# Patient Record
Sex: Female | Born: 1970
Health system: Southern US, Community
[De-identification: ages and names within clinical notes are randomized; demographics above are authoritative.]

## PROBLEM LIST (undated history)

## (undated) DIAGNOSIS — B019 Varicella without complication: Secondary | ICD-10-CM

## (undated) HISTORY — DX: Varicella without complication: B01.9

## (undated) HISTORY — PX: NO PAST SURGERIES: SHX2092

---

## 2002-03-03 ENCOUNTER — Inpatient Hospital Stay (HOSPITAL_COMMUNITY): Admission: AD | Admit: 2002-03-03 | Discharge: 2002-03-03 | Payer: Self-pay | Admitting: Obstetrics and Gynecology

## 2002-03-06 ENCOUNTER — Inpatient Hospital Stay (HOSPITAL_COMMUNITY): Admission: AD | Admit: 2002-03-06 | Discharge: 2002-03-08 | Payer: Self-pay | Admitting: Obstetrics and Gynecology

## 2003-05-07 ENCOUNTER — Other Ambulatory Visit: Admission: RE | Admit: 2003-05-07 | Discharge: 2003-05-07 | Payer: Self-pay | Admitting: Obstetrics and Gynecology

## 2004-12-17 ENCOUNTER — Other Ambulatory Visit: Admission: RE | Admit: 2004-12-17 | Discharge: 2004-12-17 | Payer: Self-pay | Admitting: Obstetrics and Gynecology

## 2010-11-23 ENCOUNTER — Other Ambulatory Visit (HOSPITAL_COMMUNITY): Payer: Self-pay | Admitting: Obstetrics and Gynecology

## 2010-11-23 DIAGNOSIS — Z1231 Encounter for screening mammogram for malignant neoplasm of breast: Secondary | ICD-10-CM

## 2010-12-02 ENCOUNTER — Ambulatory Visit (HOSPITAL_COMMUNITY)
Admission: RE | Admit: 2010-12-02 | Discharge: 2010-12-02 | Disposition: A | Discharge status: Home / Self Care | Payer: BC Managed Care – PPO | Source: Ambulatory Visit | Attending: Obstetrics and Gynecology | Admitting: Obstetrics and Gynecology

## 2010-12-02 DIAGNOSIS — Z1231 Encounter for screening mammogram for malignant neoplasm of breast: Secondary | ICD-10-CM | POA: Insufficient documentation

## 2012-01-26 ENCOUNTER — Other Ambulatory Visit (HOSPITAL_COMMUNITY): Payer: Self-pay | Admitting: Obstetrics and Gynecology

## 2012-01-26 DIAGNOSIS — Z1231 Encounter for screening mammogram for malignant neoplasm of breast: Secondary | ICD-10-CM

## 2012-02-15 ENCOUNTER — Ambulatory Visit (HOSPITAL_COMMUNITY)
Admission: RE | Admit: 2012-02-15 | Discharge: 2012-02-15 | Disposition: A | Discharge status: Home / Self Care | Payer: BC Managed Care – PPO | Source: Ambulatory Visit | Attending: Obstetrics and Gynecology | Admitting: Obstetrics and Gynecology

## 2012-02-15 DIAGNOSIS — Z1231 Encounter for screening mammogram for malignant neoplasm of breast: Secondary | ICD-10-CM

## 2013-06-12 ENCOUNTER — Encounter: Payer: Self-pay | Admitting: Nurse Practitioner

## 2013-06-12 ENCOUNTER — Ambulatory Visit (INDEPENDENT_AMBULATORY_CARE_PROVIDER_SITE_OTHER): Payer: BC Managed Care – PPO | Admitting: Nurse Practitioner

## 2013-06-12 VITALS — BP 100/68 | HR 75 | Temp 97.5°F | Ht 64.16 in | Wt 135.8 lb

## 2013-06-12 DIAGNOSIS — Z0000 Encounter for general adult medical examination without abnormal findings: Secondary | ICD-10-CM

## 2013-06-12 DIAGNOSIS — R0789 Other chest pain: Secondary | ICD-10-CM

## 2013-06-12 DIAGNOSIS — R609 Edema, unspecified: Secondary | ICD-10-CM

## 2013-06-12 DIAGNOSIS — R600 Localized edema: Secondary | ICD-10-CM

## 2013-06-12 LAB — COMPREHENSIVE METABOLIC PANEL
ALK PHOS: 48 U/L (ref 39–117)
ALT: 41 U/L — ABNORMAL HIGH (ref 0–35)
AST: 28 U/L (ref 0–37)
Albumin: 4.4 g/dL (ref 3.5–5.2)
BILIRUBIN TOTAL: 0.9 mg/dL (ref 0.3–1.2)
BUN: 6 mg/dL (ref 6–23)
CO2: 28 mEq/L (ref 19–32)
Calcium: 9.4 mg/dL (ref 8.4–10.5)
Chloride: 102 mEq/L (ref 96–112)
Creatinine, Ser: 0.5 mg/dL (ref 0.4–1.2)
GFR: 136.25 mL/min (ref >60.00)
GLUCOSE: 79 mg/dL (ref 70–99)
Potassium: 4.3 mEq/L (ref 3.5–5.1)
Sodium: 136 mEq/L (ref 135–145)
Total Protein: 8 g/dL (ref 6.0–8.3)

## 2013-06-12 LAB — CBC
HEMATOCRIT: 40.7 % (ref 36.0–46.0)
Hemoglobin: 13.5 g/dL (ref 12.0–15.0)
MCHC: 33.2 g/dL (ref 30.0–36.0)
MCV: 92.7 fl (ref 78.0–100.0)
PLATELETS: 238 10*3/uL (ref 150.0–400.0)
RBC: 4.39 Mil/uL (ref 3.87–5.11)
RDW: 12.1 % (ref 11.5–14.6)
WBC: 7.7 10*3/uL (ref 4.5–10.5)

## 2013-06-12 LAB — URINALYSIS, MICROSCOPIC ONLY

## 2013-06-12 LAB — TSH: TSH: 1.64 u[IU]/mL (ref 0.35–5.50)

## 2013-06-12 LAB — LIPID PANEL
CHOLESTEROL: 173 mg/dL (ref 0–200)
HDL: 76.5 mg/dL (ref >39.00)
LDL Cholesterol: 88 mg/dL (ref 0–99)
TRIGLYCERIDES: 44 mg/dL (ref 0.0–149.0)
Total CHOL/HDL Ratio: 2
VLDL: 8.8 mg/dL (ref 0.0–40.0)

## 2013-06-12 LAB — HIGH SENSITIVITY CRP: CRP HIGH SENSITIVITY: 1.04 mg/L (ref 0.000–5.000)

## 2013-06-12 LAB — T4, FREE: FREE T4: 0.95 ng/dL (ref 0.60–1.60)

## 2013-06-12 NOTE — Progress Notes (Signed)
Pre visit review using our clinic review tool, if applicable. No additional management support is needed unless otherwise documented below in the visit note. 

## 2013-06-12 NOTE — Patient Instructions (Signed)
Our office will call you with lab results and any necessary follow up. Continue to exercise 30 minutes most days of the week. I think the pain you are experiencing is musculoskeletal, but if it does not subside consider seeing cardiology for further work up.   Preventive Care for Adults, Female A healthy lifestyle and preventive care can promote health and wellness. Preventive health guidelines for women include the following key practices.  A routine yearly physical is a good way to check with your caregiver about your health and preventive screening. It is a chance to share any concerns and updates on your health, and to receive a thorough exam.  Visit your dentist for a routine exam and preventive care every 6 months. Brush your teeth twice a day and floss once a day. Good oral hygiene prevents tooth decay and gum disease.  The frequency of eye exams is based on your age, health, family medical history, use of contact lenses, and other factors. Follow your caregiver's recommendations for frequency of eye exams.  Eat a healthy diet. Foods like vegetables, fruits, whole grains, low-fat dairy products, and lean protein foods contain the nutrients you need without too many calories. Decrease your intake of foods high in solid fats, added sugars, and salt. Eat the right amount of calories for you.Get information about a proper diet from your caregiver, if necessary.  Regular physical exercise is one of the most important things you can do for your health. Most adults should get at least 150 minutes of moderate-intensity exercise (any activity that increases your heart rate and causes you to sweat) each week. In addition, most adults need muscle-strengthening exercises on 2 or more days a week.  Maintain a healthy weight. The body mass index (BMI) is a screening tool to identify possible weight problems. It provides an estimate of body fat based on height and weight. Your caregiver can help determine  your BMI, and can help you achieve or maintain a healthy weight.For adults 20 years and older:  A BMI below 18.5 is considered underweight.  A BMI of 18.5 to 24.9 is normal.  A BMI of 25 to 29.9 is considered overweight.  A BMI of 30 and above is considered obese.  Maintain normal blood lipids and cholesterol levels by exercising and minimizing your intake of saturated fat. Eat a balanced diet with plenty of fruit and vegetables. Blood tests for lipids and cholesterol should begin at age 44 and be repeated every 5 years. If your lipid or cholesterol levels are high, you are over 50, or you are at high risk for heart disease, you may need your cholesterol levels checked more frequently.Ongoing high lipid and cholesterol levels should be treated with medicines if diet and exercise are not effective.  If you smoke, find out from your caregiver how to quit. If you do not use tobacco, do not start.  Lung cancer screening is recommended for adults aged 16 80 years who are at high risk for developing lung cancer because of a history of smoking. Yearly low-dose computed tomography (CT) is recommended for people who have at least a 30-pack-year history of smoking and are a current smoker or have quit within the past 15 years. A pack year of smoking is smoking an average of 1 pack of cigarettes a day for 1 year (for example: 1 pack a day for 30 years or 2 packs a day for 15 years). Yearly screening should continue until the smoker has stopped smoking for at  least 15 years. Yearly screening should also be stopped for people who develop a health problem that would prevent them from having lung cancer treatment.  If you are pregnant, do not drink alcohol. If you are breastfeeding, be very cautious about drinking alcohol. If you are not pregnant and choose to drink alcohol, do not exceed 1 drink per day. One drink is considered to be 12 ounces (355 mL) of beer, 5 ounces (148 mL) of wine, or 1.5 ounces (44 mL) of  liquor.  Avoid use of street drugs. Do not share needles with anyone. Ask for help if you need support or instructions about stopping the use of drugs.  High blood pressure causes heart disease and increases the risk of stroke. Your blood pressure should be checked at least every 1 to 2 years. Ongoing high blood pressure should be treated with medicines if weight loss and exercise are not effective.  If you are 43 to 44 years old, ask your caregiver if you should take aspirin to prevent strokes.  Diabetes screening involves taking a blood sample to check your fasting blood sugar level. This should be done once every 3 years, after age 12, if you are within normal weight and without risk factors for diabetes. Testing should be considered at a younger age or be carried out more frequently if you are overweight and have at least 1 risk factor for diabetes.  Breast cancer screening is essential preventive care for women. You should practice "breast self-awareness." This means understanding the normal appearance and feel of your breasts and may include breast self-examination. Any changes detected, no matter how small, should be reported to a caregiver. Women in their 77s and 30s should have a clinical breast exam (CBE) by a caregiver as part of a regular health exam every 1 to 3 years. After age 34, women should have a CBE every year. Starting at age 62, women should consider having a mammography (breast X-ray test) every year. Women who have a family history of breast cancer should talk to their caregiver about genetic screening. Women at a high risk of breast cancer should talk to their caregivers about having magnetic resonance imaging (MRI) and a mammography every year.  Breast cancer gene (BRCA)-related cancer risk assessment is recommended for women who have family members with BRCA-related cancers. BRCA-related cancers include breast, ovarian, tubal, and peritoneal cancers. Having family members with  these cancers may be associated with an increased risk for harmful changes (mutations) in the breast cancer genes BRCA1 and BRCA2. Results of the assessment will determine the need for genetic counseling and BRCA1 and BRCA2 testing.  The Pap test is a screening test for cervical cancer. A Pap test can show cell changes on the cervix that might become cervical cancer if left untreated. A Pap test is a procedure in which cells are obtained and examined from the lower end of the uterus (cervix).  Women should have a Pap test starting at age 55.  Between ages 36 and 23, Pap tests should be repeated every 2 years.  Beginning at age 21, you should have a Pap test every 3 years as long as the past 3 Pap tests have been normal.  Some women have medical problems that increase the chance of getting cervical cancer. Talk to your caregiver about these problems. It is especially important to talk to your caregiver if a new problem develops soon after your last Pap test. In these cases, your caregiver may recommend more frequent  screening and Pap tests.  The above recommendations are the same for women who have or have not gotten the vaccine for human papillomavirus (HPV).  If you had a hysterectomy for a problem that was not cancer or a condition that could lead to cancer, then you no longer need Pap tests. Even if you no longer need a Pap test, a regular exam is a good idea to make sure no other problems are starting.  If you are between ages 4 and 53, and you have had normal Pap tests going back 10 years, you no longer need Pap tests. Even if you no longer need a Pap test, a regular exam is a good idea to make sure no other problems are starting.  If you have had past treatment for cervical cancer or a condition that could lead to cancer, you need Pap tests and screening for cancer for at least 20 years after your treatment.  If Pap tests have been discontinued, risk factors (such as a new sexual partner)  need to be reassessed to determine if screening should be resumed.  The HPV test is an additional test that may be used for cervical cancer screening. The HPV test looks for the virus that can cause the cell changes on the cervix. The cells collected during the Pap test can be tested for HPV. The HPV test could be used to screen women aged 40 years and older, and should be used in women of any age who have unclear Pap test results. After the age of 64, women should have HPV testing at the same frequency as a Pap test.  Colorectal cancer can be detected and often prevented. Most routine colorectal cancer screening begins at the age of 81 and continues through age 100. However, your caregiver may recommend screening at an earlier age if you have risk factors for colon cancer. On a yearly basis, your caregiver may provide home test kits to check for hidden blood in the stool. Use of a small camera at the end of a tube, to directly examine the colon (sigmoidoscopy or colonoscopy), can detect the earliest forms of colorectal cancer. Talk to your caregiver about this at age 2, when routine screening begins. Direct examination of the colon should be repeated every 5 to 10 years through age 35, unless early forms of pre-cancerous polyps or small growths are found.  Hepatitis C blood testing is recommended for all people born from [birth year] through 1965 and any individual with known risks for hepatitis C.  Practice safe sex. Use condoms and avoid high-risk sexual practices to reduce the spread of sexually transmitted infections (STIs). STIs include gonorrhea, chlamydia, syphilis, trichomonas, herpes, HPV, and human immunodeficiency virus (HIV). Herpes, HIV, and HPV are viral illnesses that have no cure. They can result in disability, cancer, and death. Sexually active women aged 3 and younger should be checked for chlamydia. Older women with new or multiple partners should also be tested for chlamydia. Testing for other  STIs is recommended if you are sexually active and at increased risk.  Osteoporosis is a disease in which the bones lose minerals and strength with aging. This can result in serious bone fractures. The risk of osteoporosis can be identified using a bone density scan. Women ages 43 and over and women at risk for fractures or osteoporosis should discuss screening with their caregivers. Ask your caregiver whether you should take a calcium supplement or vitamin D to reduce the rate of osteoporosis.  Menopause can  be associated with physical symptoms and risks. Hormone replacement therapy is available to decrease symptoms and risks. You should talk to your caregiver about whether hormone replacement therapy is right for you.  Use sunscreen. Apply sunscreen liberally and repeatedly throughout the day. You should seek shade when your shadow is shorter than you. Protect yourself by wearing long sleeves, pants, a wide-brimmed hat, and sunglasses year round, whenever you are outdoors.  Once a month, do a whole body skin exam, using a mirror to look at the skin on your back. Notify your caregiver of new moles, moles that have irregular borders, moles that are larger than a pencil eraser, or moles that have changed in shape or color.  Stay current with required immunizations.  Influenza vaccine. All adults should be immunized every year.  Tetanus, diphtheria, and acellular pertussis (Td, Tdap) vaccine. Pregnant women should receive 1 dose of Tdap vaccine during each pregnancy. The dose should be obtained regardless of the length of time since the last dose. Immunization is preferred during the 27th to 36th week of gestation. An adult who has not previously received Tdap or who does not know her vaccine status should receive 1 dose of Tdap. This initial dose should be followed by tetanus and diphtheria toxoids (Td) booster doses every 10 years. Adults with an unknown or incomplete history of completing a 3-dose  immunization series with Td-containing vaccines should begin or complete a primary immunization series including a Tdap dose. Adults should receive a Td booster every 10 years.  Varicella vaccine. An adult without evidence of immunity to varicella should receive 2 doses or a second dose if she has previously received 1 dose. Pregnant females who do not have evidence of immunity should receive the first dose after pregnancy. This first dose should be obtained before leaving the health care facility. The second dose should be obtained 4 8 weeks after the first dose.  Human papillomavirus (HPV) vaccine. Females aged 98 26 years who have not received the vaccine previously should obtain the 3-dose series. The vaccine is not recommended for use in pregnant females. However, pregnancy testing is not needed before receiving a dose. If a female is found to be pregnant after receiving a dose, no treatment is needed. In that case, the remaining doses should be delayed until after the pregnancy. Immunization is recommended for any person with an immunocompromised condition through the age of 64 years if she did not get any or all doses earlier. During the 3-dose series, the second dose should be obtained 4 8 weeks after the first dose. The third dose should be obtained 24 weeks after the first dose and 16 weeks after the second dose.  Zoster vaccine. One dose is recommended for adults aged 46 years or older unless certain conditions are present.  Measles, mumps, and rubella (MMR) vaccine. Adults born before [birth year] generally are considered immune to measles and mumps. Adults born in [birth year] or later should have 1 or more doses of MMR vaccine unless there is a contraindication to the vaccine or there is laboratory evidence of immunity to each of the three diseases. A routine second dose of MMR vaccine should be obtained at least 28 days after the first dose for students attending postsecondary schools, health care workers, or  international travelers. People who received inactivated measles vaccine or an unknown type of measles vaccine during 1963 1967 should receive 2 doses of MMR vaccine. People who received inactivated mumps vaccine or an unknown type of mumps  vaccine before 1979 and are at high risk for mumps infection should consider immunization with 2 doses of MMR vaccine. For females of childbearing age, rubella immunity should be determined. If there is no evidence of immunity, females who are not pregnant should be vaccinated. If there is no evidence of immunity, females who are pregnant should delay immunization until after pregnancy. Unvaccinated health care workers born before [birth year] who lack laboratory evidence of measles, mumps, or rubella immunity or laboratory confirmation of disease should consider measles and mumps immunization with 2 doses of MMR vaccine or rubella immunization with 1 dose of MMR vaccine.  Pneumococcal 13-valent conjugate (PCV13) vaccine. When indicated, a person who is uncertain of her immunization history and has no record of immunization should receive the PCV13 vaccine. An adult aged 54 years or older who has certain medical conditions and has not been previously immunized should receive 1 dose of PCV13 vaccine. This PCV13 should be followed with a dose of pneumococcal polysaccharide (PPSV23) vaccine. The PPSV23 vaccine dose should be obtained at least 8 weeks after the dose of PCV13 vaccine. An adult aged 41 years or older who has certain medical conditions and previously received 1 or more doses of PPSV23 vaccine should receive 1 dose of PCV13. The PCV13 vaccine dose should be obtained 1 or more years after the last PPSV23 vaccine dose.  Pneumococcal polysaccharide (PPSV23) vaccine. When PCV13 is also indicated, PCV13 should be obtained first. All adults aged 107 years and older should be immunized. An adult younger than age 52 years who has certain medical conditions should be immunized. Any  person who resides in a nursing home or long-term care facility should be immunized. An adult smoker should be immunized. People with an immunocompromised condition and certain other conditions should receive both PCV13 and PPSV23 vaccines. People with human immunodeficiency virus (HIV) infection should be immunized as soon as possible after diagnosis. Immunization during chemotherapy or radiation therapy should be avoided. Routine use of PPSV23 vaccine is not recommended for American Indians, [place] Natives, or people younger than 65 years unless there are medical conditions that require PPSV23 vaccine. When indicated, people who have unknown immunization and have no record of immunization should receive PPSV23 vaccine. One-time revaccination 5 years after the first dose of PPSV23 is recommended for people aged 60 64 years who have chronic kidney failure, nephrotic syndrome, asplenia, or immunocompromised conditions. People who received 1 2 doses of PPSV23 before age 26 years should receive another dose of PPSV23 vaccine at age 41 years or later if at least 5 years have passed since the previous dose. Doses of PPSV23 are not needed for people immunized with PPSV23 at or after age 30 years.  Meningococcal vaccine. Adults with asplenia or persistent complement component deficiencies should receive 2 doses of quadrivalent meningococcal conjugate (MenACWY-D) vaccine. The doses should be obtained at least 2 months apart. Microbiologists working with certain meningococcal bacteria, Section recruits, people at risk during an outbreak, and people who travel to or live in countries with a high rate of meningitis should be immunized. A first-year college student up through age 58 years who is living in a residence hall should receive a dose if she did not receive a dose on or after her 16th birthday. Adults who have certain high-risk conditions should receive one or more doses of vaccine.  Hepatitis A vaccine. Adults  who wish to be protected from this disease, have certain high-risk conditions, work with hepatitis A-infected animals, work in hepatitis A  research labs, or travel to or work in countries with a high rate of hepatitis A should be immunized. Adults who were previously unvaccinated and who anticipate close contact with an international adoptee during the first 60 days after arrival in the [place] from a country with a high rate of hepatitis A should be immunized.  Hepatitis B vaccine. Adults who wish to be protected from this disease, have certain high-risk conditions, may be exposed to blood or other infectious body fluids, are household contacts or sex partners of hepatitis B positive people, are clients or workers in certain care facilities, or travel to or work in countries with a high rate of hepatitis B should be immunized.  Haemophilus influenzae type b (Hib) vaccine. A previously unvaccinated person with asplenia or sickle cell disease or having a scheduled splenectomy should receive 1 dose of Hib vaccine. Regardless of previous immunization, a recipient of a hematopoietic stem cell transplant should receive a 3-dose series 6 12 months after her successful transplant. Hib vaccine is not recommended for adults with HIV infection. Preventive Services / Frequency Ages 34 to 51  Blood pressure check.** / Every 1 to 2 years.  Lipid and cholesterol check.** / Every 5 years beginning at age 52.  Lung cancer screening. / Every year if you are aged 70 80 years and have a 30-pack-year history of smoking and currently smoke or have quit within the past 15 years. Yearly screening is stopped once you have quit smoking for at least 15 years or develop a health problem that would prevent you from having lung cancer treatment.  Clinical breast exam.** / Every year after age 31.  BRCA-related cancer risk assessment.** / For women who have family members with a BRCA-related cancer (breast, ovarian, tubal,  or peritoneal cancers).  Mammogram.** / Every year beginning at age 39 and continuing for as long as you are in good health. Consult with your caregiver.  Pap test.** / Every 3 years starting at age 65 through age 37 or 43 with a history of 3 consecutive normal Pap tests.  HPV screening.** / Every 3 years from ages 34 through ages 65 to 62 with a history of 3 consecutive normal Pap tests.  Fecal occult blood test (FOBT) of stool. / Every year beginning at age 31 and continuing until age 69. You may not need to do this test if you get a colonoscopy every 10 years.  Flexible sigmoidoscopy or colonoscopy.** / Every 5 years for a flexible sigmoidoscopy or every 10 years for a colonoscopy beginning at age 41 and continuing until age 80.  Hepatitis C blood test.** / For all people born from [birth year] through 1966 and any individual with known risks for hepatitis C.  Skin self-exam. / Monthly.  Influenza vaccine. / Every year.  Tetanus, diphtheria, and acellular pertussis (Tdap/Td) vaccine.** / Consult your caregiver. Pregnant women should receive 1 dose of Tdap vaccine during each pregnancy. 1 dose of Td every 10 years.  Varicella vaccine.** / Consult your caregiver. Pregnant females who do not have evidence of immunity should receive the first dose after pregnancy.  Zoster vaccine.** / 1 dose for adults aged 75 years or older.  Measles, mumps, rubella (MMR) vaccine.** / You need at least 1 dose of MMR if you were born in 1957 or later. You may also need a 2nd dose. For females of childbearing age, rubella immunity should be determined. If there is no evidence of immunity, females who are not pregnant should be  vaccinated. If there is no evidence of immunity, females who are pregnant should delay immunization until after pregnancy.  Pneumococcal 13-valent conjugate (PCV13) vaccine.** / Consult your caregiver.  Pneumococcal polysaccharide (PPSV23) vaccine.** / 1 to 2 doses if you smoke cigarettes or  if you have certain conditions.  Meningococcal vaccine.** / Consult your caregiver.  Hepatitis A vaccine.** / Consult your caregiver.  Hepatitis B vaccine.** / Consult your caregiver.  Haemophilus influenzae type b (Hib) vaccine.** / Consult your caregiver. ** Family history and personal history of risk and conditions may change your caregiver's recommendations. Document Released: 05/25/2001 Document Revised: 07/24/2012 Document Reviewed: 08/24/2010 [facility] Patient Information 2014 [place], [place].

## 2013-06-13 LAB — VITAMIN D 25 HYDROXY (VIT D DEFICIENCY, FRACTURES): Vit D, 25-Hydroxy: 49 ng/mL (ref 30–89)

## 2013-06-13 LAB — HEMOGLOBIN A1C: HEMOGLOBIN A1C: 5.2 % (ref 4.6–6.5)

## 2013-06-14 ENCOUNTER — Encounter: Payer: Self-pay | Admitting: Nurse Practitioner

## 2013-06-14 DIAGNOSIS — R600 Localized edema: Secondary | ICD-10-CM | POA: Insufficient documentation

## 2013-06-14 DIAGNOSIS — R0789 Other chest pain: Secondary | ICD-10-CM | POA: Insufficient documentation

## 2013-06-14 DIAGNOSIS — Z0000 Encounter for general adult medical examination without abnormal findings: Secondary | ICD-10-CM | POA: Insufficient documentation

## 2013-06-14 NOTE — Assessment & Plan Note (Addendum)
FROM bilat shoulders. No pectoral or trap tenderness. FROM neck & head. No carotid bruit. Heart RRR. Nml exam. Likely musculoskeletal as pain relieved by ibuprophen. Labs today: lipids, CBC, TSH, CMET, hs-CRP Pt declines cardio referral.  Will give symptoms few more weeks. If no resolve will call ofc.

## 2013-06-14 NOTE — Assessment & Plan Note (Signed)
No edema on exam. Few superficial veins, no bulge. Pedal pulses +2. Nml color. Continue hydrating fluids & limiting caffeinated beverages. Stand & Walk few minutes every hour while at work. CMET, TSH CBC today.

## 2013-06-14 NOTE — Assessment & Plan Note (Signed)
CPE today. Will continue to see gyn for womancare. Recmd Tdap & flu, pt declines today. Vit D & urine micro, CBC, CMET, TSH, lipids, HgbA1C. No risk factors for DM, or heart ds. Next CPE 2 yrs.

## 2013-06-14 NOTE — Progress Notes (Signed)
Subjective:     Gilles ChiquitoKelly Munguia is a 43 y.o. female and is here for a comprehensive physical exam. The patient reports problems - bilateral shoulder & chest discomfort.Marland Kitchen.  History   Social History  . Marital Status: Married    Spouse Name: Bill    Number of Children: 2  . Years of Education: College Gd   Occupational History  . 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 Smoking status: Never Smoker   . Smokeless tobacco: Never Used  . Alcohol Use: No  . Drug Use: No  . Sexual Activity: Yes    Birth Control/ Protection: Surgical   Other Topics Concern  . Not on file   Social History Narrative   Ms. Whetsel lives with her husband & 2 children. She works PT as a Marketing executivefinancial plan administrator. She is from [place]   Health Maintenance  Topic Date Due  . Influenza Vaccine  02/14/2014 (Originally 11/10/2012)  . Tetanus/tdap  06/15/2015 (Originally 08/08/1988)  . Pap Smear  06/15/2014    The following portions of the patient's history were reviewed and updated as appropriate: allergies, current medications, past family history, past medical history, past social history, past surgical history and problem list.  Review of Systems Constitutional: negative for chills, fatigue, fevers and night sweats Eyes: negative, last eye exam 2013.  Ears, nose, mouth, throat, and face: negative for nasal congestion and sore throat Respiratory: negative for asthma, cough and sputum Cardiovascular: positive for chest pressure/discomfort and lower extremity edema, negative for chest pain, dyspnea, fatigue, irregular heart beat, near-syncope and palpitations Genitourinary:negative, regular MC Musculoskeletal:positive for pain in bilat shoulders & along clavicle, posterior neck, negative for back pain, muscle weakness, myalgias and stiff joints Behavioral/Psych: negative for anxiety, decreased appetite, depression, excessive alcohol consumption, fatigue, illegal drug usage, sleep disturbance and  tobacco use   Objective:    BP 100/68  Pulse 75  Temp(Src) 97.5 F (36.4 C) (Oral)  Ht 5' 4.16" (1.63 m)  Wt 135 lb 12 oz (61.576 kg)  BMI 23.18 kg/m2  SpO2 98%  LMP 05/29/2013 General appearance: alert, cooperative, appears stated age and no distress Head: Normocephalic, without obvious abnormality, atraumatic Eyes: negative findings: lids and lashes normal, conjunctivae and sclerae normal, corneas clear and pupils equal, round, reactive to light and accomodation Neck: no adenopathy, no carotid bruit, supple, symmetrical, trachea midline and thyroid not enlarged, symmetric, no tenderness/mass/nodules Back: no kyphosis present, no scoliosis present, no tenderness to percussion or palpation, range of motion normal Lungs: clear to auscultation bilaterally Heart: regular rate and rhythm, S1, S2 normal, no murmur, click, rub or gallop Extremities: extremities normal, atraumatic, no cyanosis or edema and few visible veins, no bulge Pulses: 2+ and symmetric Lymph nodes: Cervical, supraclavicular, and axillary nodes normal.    Assessment:    Healthy female exam. Chest/neck/shoulder discomfort, relieved by ibuprophen. Does not change w/movement, unable to reproduce. LE edema-pt reports 2 episodes that lasted 2 days. Resolved with increased water intake.     Plan:    See preventive care, chest discomfort, LE edema in problem list for detailed A&P. See After Visit Summary for Counseling Recommendations

## 2016-03-23 DIAGNOSIS — Z6824 Body mass index (BMI) 24.0-24.9, adult: Secondary | ICD-10-CM | POA: Diagnosis not present

## 2016-03-23 DIAGNOSIS — Z130 Encounter for screening for diseases of the blood and blood-forming organs and certain disorders involving the immune mechanism: Secondary | ICD-10-CM | POA: Diagnosis not present

## 2016-03-23 DIAGNOSIS — Z1231 Encounter for screening mammogram for malignant neoplasm of breast: Secondary | ICD-10-CM | POA: Diagnosis not present

## 2016-03-23 DIAGNOSIS — Z01419 Encounter for gynecological examination (general) (routine) without abnormal findings: Secondary | ICD-10-CM | POA: Diagnosis not present

## 2016-03-23 DIAGNOSIS — Z1389 Encounter for screening for other disorder: Secondary | ICD-10-CM | POA: Diagnosis not present

## 2017-03-14 DIAGNOSIS — Z23 Encounter for immunization: Secondary | ICD-10-CM | POA: Diagnosis not present

## 2017-04-27 DIAGNOSIS — Z130 Encounter for screening for diseases of the blood and blood-forming organs and certain disorders involving the immune mechanism: Secondary | ICD-10-CM | POA: Diagnosis not present

## 2017-04-27 DIAGNOSIS — Z1389 Encounter for screening for other disorder: Secondary | ICD-10-CM | POA: Diagnosis not present

## 2017-04-27 DIAGNOSIS — Z6824 Body mass index (BMI) 24.0-24.9, adult: Secondary | ICD-10-CM | POA: Diagnosis not present

## 2017-04-27 DIAGNOSIS — Z1231 Encounter for screening mammogram for malignant neoplasm of breast: Secondary | ICD-10-CM | POA: Diagnosis not present

## 2017-04-27 DIAGNOSIS — N393 Stress incontinence (female) (male): Secondary | ICD-10-CM | POA: Diagnosis not present

## 2017-04-27 DIAGNOSIS — Z01419 Encounter for gynecological examination (general) (routine) without abnormal findings: Secondary | ICD-10-CM | POA: Diagnosis not present

## 2017-04-27 LAB — HM MAMMOGRAPHY

## 2017-07-11 ENCOUNTER — Ambulatory Visit (INDEPENDENT_AMBULATORY_CARE_PROVIDER_SITE_OTHER): Payer: BLUE CROSS/BLUE SHIELD | Admitting: Family Medicine

## 2017-07-11 ENCOUNTER — Encounter: Payer: Self-pay | Admitting: Family Medicine

## 2017-07-11 VITALS — BP 96/62 | HR 69 | Temp 97.4°F | Ht 64.16 in | Wt 147.2 lb

## 2017-07-11 DIAGNOSIS — Z7689 Persons encountering health services in other specified circumstances: Secondary | ICD-10-CM | POA: Diagnosis not present

## 2017-07-11 DIAGNOSIS — M7542 Impingement syndrome of left shoulder: Secondary | ICD-10-CM

## 2017-07-11 MED ORDER — MELOXICAM 15 MG PO TABS: 15.0000 mg | ORAL_TABLET | Freq: Every day | ORAL | 11 refills | Status: DC

## 2017-07-11 MED ORDER — BACLOFEN 5 MG PO TABS
5.0000 mg | ORAL_TABLET | Freq: Two times a day (BID) | ORAL | 0 refills | Status: DC | PRN
Start: 2017-07-11 — End: 2019-11-16

## 2017-07-11 NOTE — Progress Notes (Signed)
Patient ID: Christy Galvan, female  DOB: [birth year], 47 y.o.   MRN: [id number] Patient Care Team    Relationship Specialty Notifications Start End  Natalia Leatherwood, DO PCP - General Family Medicine  07/11/17   Lavina Hamman, MD Consulting Physician Obstetrics and Gynecology  07/11/17     Chief Complaint  Patient presents with  . Establish Care    pt c/o left arm and shoulder pain and numbness in 3 - 5th digits fingers.    Subjective:  Christy Galvan is a 47 y.o.  female present for new patient establishment. All past medical history, surgical history, allergies, family history, immunizations, medications and social history were obtained and entered in the electronic medical record today. All recent labs, ED visits and hospitalizations within the last year were reviewed.  Left shoulder/arm discomfort: Patient reports left arm discomfort, decreased range of motion and tingling in her left third through fifth fingers started November 2018.  She initially thought she slept on it wrong and did not make much of it.  She reports over the last 5-6 weeks the discomfort has progressed.  She denies any neck or shoulder injury.  She works in a sedentary job with repetitive motion on the keyboard.  She has tried Aleve on occasions to help relieve the pain without great resolution.  Again, she denies any injury prior to onset, she denies history of arthritis or surgery to neck/shoulder.  Depression screen PHQ 2/9 07/11/2017  Decreased Interest 0  Down, Depressed, Hopeless 0  PHQ - 2 Score 0   No flowsheet data found.  Current Exercise Habits: Structured exercise class, Type of exercise: walking, Time (Minutes): 60, Frequency (Times/Week): 5(5 miles every weekend), Weekly Exercise (Minutes/Week): 300, Intensity: Moderate   Fall Risk  07/11/2017  Falls in the past year? No     There is no immunization history on file for this patient.  No exam data present  Past Medical History:  Diagnosis  Date  . Chicken pox    No Known Allergies Past Surgical History:  Procedure Laterality Date  . NO PAST SURGERIES     Family History  Problem Relation Age of Onset  . Cancer Mother        Breast and Lung  . Lung cancer Mother   . Alcohol abuse Father   . Mental retardation Sister        bipolar  . Asthma Sister   . COPD Sister   . Depression Sister   . ADD / ADHD Daughter   . Heart disease Maternal Grandmother        before age 52  . Diabetes Maternal Grandmother   . Heart disease Maternal Grandfather        smoker   Social History   Socioeconomic History  . Marital status: Married    Spouse name: Annette Stable  . Number of children: 2  . Years of education: College Gd  . Highest education level: Not on file  Occupational History  . Occupation: Education officer, environmental  Social Needs  . Financial resource strain: Not on file  . Food insecurity:    Worry: Not on file    Inability: Not on file  . Transportation needs:    Medical: Not on file    Non-medical: Not on file  Tobacco Use  . Smoking status: Never Smoker  . Smokeless tobacco: Never Used  Substance and Sexual Activity  . Alcohol use: No  . Drug use: No  .  Sexual activity: Yes    Partners: Male    Birth control/protection: Surgical  Lifestyle  . Physical activity:    Days per week: Not on file    Minutes per session: Not on file  . Stress: Not on file  Relationships  . Social connections:    Talks on phone: Not on file    Gets together: Not on file    Attends religious service: Not on file    Active member of club or organization: Not on file    Attends meetings of clubs or organizations: Not on file    Relationship status: Not on file  . Intimate partner violence:    Fear of current or ex partner: Not on file    Emotionally abused: Not on file    Physically abused: Not on file    Forced sexual activity: Not on file  Other Topics Concern  . Not on file  Social History Narrative   Ms. Neu lives  with her husband & 2 children. She works PT as a Marketing executivefinancial plan administrator. She is from [place]   Social alcohol consumption.  Non-smoker.   Processes routinely.   X a daily vitamin.   Smoke alarm present in the home.  Wears her seatbelt.   Feels safe in her relationships.   Allergies as of 07/11/2017   No Known Allergies     Medication List        Accurate as of 07/11/17  5:11 PM. Always use your most recent med list.          Baclofen 5 MG Tabs Take 5 mg by mouth 2 (two) times daily as needed.   meloxicam 15 MG tablet Commonly known as:  MOBIC Take 1 tablet (15 mg total) by mouth daily.   MULTIVITAMIN WOMEN Tabs Take by mouth.       All past medical history, surgical history, allergies, family history, immunizations andmedications were updated in the EMR today and reviewed under the history and medication portions of their EMR.    No results found for this or any previous visit (from the past 2160 hour(s)).  ROS: 14 pt review of systems performed and negative (unless mentioned in an HPI)  Objective: BP 96/62 (BP Location: Left Arm, Patient Position: Sitting, Cuff Size: Normal)   Pulse 69   Temp (!) 97.4 F (36.3 C) (Oral)   Ht 5' 4.16" (1.63 m)   Wt 147 lb 3.2 oz (66.8 kg)   LMP 06/28/2017   SpO2 100%   BMI 25.14 kg/m  Gen: Afebrile. No acute distress. Nontoxic in appearance, well-developed, well-nourished, thin Caucasian female. HENT: AT. [place]. . MMM, no oral lesions Eyes:Pupils Equal Round Reactive to light, Extraocular movements intact,  Conjunctiva without redness, discharge or icterus. CV: RRR. Chest: CTAB, no wheeze, rhonchi or crackles.  Skin: no rashes, purpura or petechiae. Warm and well-perfused. Skin intact. Neuro/Msk: Normal gait. PERLA. EOMi. Alert. Oriented x3.      -Left upper extremity: No erythema, no soft tissue swelling.  Decreased range of motion in extension and abduction to 90 degrees only.  Positive empty can test, positive Hawkins, positive  O'Brien's, positive liftoff test.  Negative Tinel's at elbow and wrist.  Neurovascularly intact distally. Psych: Normal affect, dress and demeanor. Normal speech. Normal thought content and judgment.  Assessment/plan: Christy Galvan is a 47 y.o. female present for establishment of care, with chronic left shoulder pain. Impingement syndrome of left shoulder -Patient's exam is consistent with impingement syndrome and possibly  rotator or shoulder girdle injury, though she does not recall a specific injury.  Discussed different medications that can be attempted to help relieve symptoms.  Given this is now chronic in nature, present since November (> 5 months), is unlikely she will get complete relief with oral therapy alone.  She is agreeable to starting daily anti-inflammatory and muscle relaxer.   -Discussed referral to orthopedic surgery to further investigate impingement cause.  She is agreeable to this today. -Mobic 15 mg daily with food.  Baclofen 5 mg twice daily as needed. - Ambulatory referral to Orthopedic Surgery -Follow-up as needed  Return if symptoms worsen or fail to improve.   Note is dictated utilizing voice recognition software. Although note has been proof read prior to signing, occasional typographical errors still can be missed. If any questions arise, please do not hesitate to call for verification.  Electronically signed by: Felix Pacini, DO [facility] [place]

## 2017-07-11 NOTE — Patient Instructions (Signed)
Shoulder Impingement Syndrome Shoulder impingement syndrome is a condition that causes pain when connective tissues (tendons) surrounding the shoulder joint become pinched. These tendons are part of the group of muscles and tissues that help to stabilize the shoulder (rotator cuff). Beneath the rotator cuff is a fluid-filled sac (bursa) that allows the muscles and tendons to glide smoothly. The bursa may become swollen or irritated (bursitis). Bursitis, swelling in the rotator cuff tendons, or both conditions can decrease how much space is under a bone in the shoulder joint (acromion), resulting in impingement. What are the causes? Shoulder impingement syndrome can be caused by bursitis or swelling of the rotator cuff tendons, which may result from:  Repetitive overhead arm movements.  Falling onto the shoulder.  Weakness in the shoulder muscles.  What increases the risk? You may be more likely to develop this condition if you are an athlete who participates in:  Sports that involve throwing, such as baseball.  Tennis.  Swimming.  Volleyball.  Some people are also more likely to develop impingement syndrome because of the shape of their acromion bone. What are the signs or symptoms? The main symptom of this condition is pain on the front or side of the shoulder. Pain may:  Get worse when lifting or raising the arm.  Get worse at night.  Wake you up from sleeping.  Feel sharp when the shoulder is moved, and then fade to an ache.  Other signs and symptoms may include:  Tenderness.  Stiffness.  Inability to raise the arm above shoulder level or behind the body.  Weakness.  How is this diagnosed? This condition may be diagnosed based on:  Your symptoms.  Your medical history.  A physical exam.  Imaging tests, such as: ? X-rays. ? MRI. ? Ultrasound.  How is this treated? Treatment for this condition may include:  Resting your shoulder and avoiding all  activities that cause pain or put stress on the shoulder.  Icing your shoulder.  NSAIDs to help reduce pain and swelling.  One or more injections of medicines to numb the area and reduce inflammation.  Physical therapy.  Surgery. This may be needed if nonsurgical treatments have not helped. Surgery may involve repairing the rotator cuff, reshaping the acromion, or removing the bursa.  Follow these instructions at home: Managing pain, stiffness, and swelling  If directed, apply ice to the injured area. ? Put ice in a plastic bag. ? Place a towel between your skin and the bag. ? Leave the ice on for 20 minutes, 2-3 times a day. Activity  Rest and return to your normal activities as told by your health care provider. Ask your health care provider what activities are safe for you.  Do exercises as told by your health care provider. General instructions  Do not use any tobacco products, including cigarettes, chewing tobacco, or e-cigarettes. Tobacco can delay healing. If you need help quitting, ask your health care provider.  Ask your health care provider when it is safe for you to drive.  Take over-the-counter and prescription medicines only as told by your health care provider.  Keep all follow-up visits as told by your health care provider. This is important. How is this prevented?  Give your body time to rest between periods of activity.  Be safe and responsible while being active to avoid falls.  Maintain physical fitness, including strength and flexibility. Contact a health care provider if:  Your symptoms have not improved after 1-2 months of treatment and   rest.  You cannot lift your arm away from your body. This information is not intended to replace advice given to you by your health care provider. Make sure you discuss any questions you have with your health care provider. Document Released: 03/29/2005 Document Revised: 12/04/2015 Document Reviewed:  03/01/2015 Elsevier Interactive Patient Education  2018 Elsevier Inc.  

## 2017-07-14 ENCOUNTER — Encounter: Payer: Self-pay | Admitting: *Deleted

## 2017-07-26 ENCOUNTER — Ambulatory Visit (INDEPENDENT_AMBULATORY_CARE_PROVIDER_SITE_OTHER): Payer: Self-pay | Admitting: Orthopaedic Surgery

## 2017-08-09 ENCOUNTER — Ambulatory Visit (INDEPENDENT_AMBULATORY_CARE_PROVIDER_SITE_OTHER): Payer: BLUE CROSS/BLUE SHIELD

## 2017-08-09 ENCOUNTER — Ambulatory Visit (INDEPENDENT_AMBULATORY_CARE_PROVIDER_SITE_OTHER): Payer: BLUE CROSS/BLUE SHIELD | Admitting: Orthopaedic Surgery

## 2017-08-09 ENCOUNTER — Encounter (INDEPENDENT_AMBULATORY_CARE_PROVIDER_SITE_OTHER): Payer: Self-pay | Admitting: Orthopaedic Surgery

## 2017-08-09 DIAGNOSIS — G8929 Other chronic pain: Secondary | ICD-10-CM | POA: Insufficient documentation

## 2017-08-09 DIAGNOSIS — M25512 Pain in left shoulder: Secondary | ICD-10-CM | POA: Diagnosis not present

## 2017-08-09 MED ORDER — BUPIVACAINE HCL 0.25 % IJ SOLN
2.0000 mL | INTRAMUSCULAR | Status: AC | PRN
  Administered 2017-08-09: 2 mL via INTRA_ARTICULAR

## 2017-08-09 MED ORDER — LIDOCAINE HCL 2 % IJ SOLN
2.0000 mL | INTRAMUSCULAR | Status: AC | PRN
Start: 2017-08-09 — End: 2017-08-09
  Administered 2017-08-09: 2 mL

## 2017-08-09 MED ORDER — METHYLPREDNISOLONE ACETATE 40 MG/ML IJ SUSP
40.0000 mg | INTRAMUSCULAR | Status: AC | PRN
  Administered 2017-08-09: 40 mg via INTRA_ARTICULAR

## 2017-08-09 NOTE — Progress Notes (Signed)
Office Visit Note   Patient: Christy Galvan           Date of Birth: 06-Apr-1971           MRN: [id number] Visit Date: 08/09/2017              Requested by: Natalia Leatherwood, DO [address] [place], [place] [address] PCP: Natalia Leatherwood, DO   Assessment & Plan: Visit Diagnoses:  1. Chronic left shoulder pain     Plan: Impression is left shoulder subacromial bursitis/impingement syndrome with  Associated cervical spine radiculopathy.  We will proceed with a diagnostic and hopefully therapeutic subacromial cortisone injection to the left shoulder today.  Will also provide the patient with a gentle exercise program.  She will follow-up with [place] on an as-needed basis.  Follow-Up Instructions: Return if symptoms worsen or fail to improve.   Orders:  Orders Placed This Encounter  Procedures  . Large Joint Inj: L subacromial bursa  . XR Shoulder Left   No orders of the defined types were placed in this encounter.     Procedures: Large Joint Inj: L subacromial bursa on 08/09/2017 3:31 PM Indications: pain Details: 22 G needle Medications: 2 mL lidocaine 2 %; 2 mL bupivacaine 0.25 %; 40 mg methylPREDNISolone acetate 40 MG/ML Outcome: tolerated well, no immediate complications Patient was prepped and draped in the usual sterile fashion.       Clinical Data: No additional findings.   Subjective: Chief Complaint  Patient presents with  . Left Shoulder - Pain    HPI patient is a pleasant 47 year old female who presents to our clinic today with left shoulder pain.  This began proximally 2-1/2 months ago without any known injury or change in activity.  Pain she has is primarily to her deltoid but does radiate to the lateral side of her neck and down into her forearm.  She describes this as a constant pain with occasional fire sensation to the deltoid.  Pain is worse with abduction as well as sleeping on the left side.  She was given baclofen as well as oral anti-inflammatories from  her primary care provider which have been of no relief.  She does note some numbness and tingling to the long ring and small fingers of the left hand.  No previous shoulder or neck pathology.  Review of Systems as detailed in HPI.  All others reviewed and are negative.   Objective: Vital Signs: There were no vitals taken for this visit.  Physical Exam well-developed well-nourished female no acute distress.  Alert and oriented x3.  Ortho Exam examination of her left shoulder reveals 50% active range of motion which is pain mediated.  She can internally rotate to almost her back pocket.  Markedly positive empty can.  She does have stiffness with range of motion of the neck.  No spinous or paraspinous tenderness.  Specialty Comments:  No specialty comments available.  Imaging: Xr Shoulder Left  Result Date: 08/09/2017 X-rays of the left shoulder negative for structural normalities    PMFS History: Patient Active Problem List   Diagnosis Date Noted  . Chronic left shoulder pain 08/09/2017  . Preventative health care 06/14/2013  . Lower extremity edema 06/14/2013   Past Medical History:  Diagnosis Date  . Chicken pox     Family History  Problem Relation Age of Onset  . Cancer Mother        Breast and Lung  . Lung cancer Mother   .  Alcohol abuse Father   . Mental retardation Sister        bipolar  . Asthma Sister   . COPD Sister   . Depression Sister   . ADD / ADHD Daughter   . Heart disease Maternal Grandmother        before age 40  . Diabetes Maternal Grandmother   . Heart disease Maternal Grandfather        smoker    Past Surgical History:  Procedure Laterality Date  . NO PAST SURGERIES     Social History   Occupational History  . Occupation: Education officer, environmental  Tobacco Use  . Smoking status: Never Smoker  . Smokeless tobacco: Never Used  Substance and Sexual Activity  . Alcohol use: No  . Drug use: No  . Sexual activity: Yes    Partners: Male      Birth control/protection: Surgical

## 2017-08-24 ENCOUNTER — Telehealth (INDEPENDENT_AMBULATORY_CARE_PROVIDER_SITE_OTHER): Payer: Self-pay | Admitting: Radiology

## 2017-08-24 DIAGNOSIS — M25512 Pain in left shoulder: Secondary | ICD-10-CM

## 2017-08-24 NOTE — Telephone Encounter (Signed)
Patient called, LMVM and said that she is still hurting and wants to proceed with the MRI scan now, pls advise?

## 2017-08-25 NOTE — Telephone Encounter (Signed)
IC patient and she says injection did help for that afternoon, then pain was back.  MRI order entered, pt will call [facility] to schedule

## 2017-08-25 NOTE — Addendum Note (Signed)
Addended by: Cherre Huger E on: 08/25/2017 08:39 AM   Modules accepted: Orders

## 2017-08-25 NOTE — Telephone Encounter (Signed)
Will you double check with patient that the injection at least helped for the first few hours with the numbing medicine?  If so, will you order MRI left shoulder.  Thank you

## 2017-08-31 ENCOUNTER — Ambulatory Visit
Admission: RE | Admit: 2017-08-31 | Discharge: 2017-08-31 | Disposition: A | Discharge status: Home / Self Care | Payer: BLUE CROSS/BLUE SHIELD | Source: Ambulatory Visit | Attending: Physician Assistant | Admitting: Physician Assistant

## 2017-08-31 DIAGNOSIS — M25512 Pain in left shoulder: Secondary | ICD-10-CM

## 2017-09-01 NOTE — Progress Notes (Signed)
Fu to discuss mri

## 2017-09-07 ENCOUNTER — Ambulatory Visit (INDEPENDENT_AMBULATORY_CARE_PROVIDER_SITE_OTHER): Payer: BLUE CROSS/BLUE SHIELD | Admitting: Orthopaedic Surgery

## 2017-09-07 ENCOUNTER — Telehealth (INDEPENDENT_AMBULATORY_CARE_PROVIDER_SITE_OTHER): Payer: Self-pay | Admitting: *Deleted

## 2017-09-07 ENCOUNTER — Encounter (INDEPENDENT_AMBULATORY_CARE_PROVIDER_SITE_OTHER): Payer: Self-pay | Admitting: Orthopaedic Surgery

## 2017-09-07 DIAGNOSIS — M7502 Adhesive capsulitis of left shoulder: Secondary | ICD-10-CM | POA: Diagnosis not present

## 2017-09-07 DIAGNOSIS — M25512 Pain in left shoulder: Secondary | ICD-10-CM | POA: Insufficient documentation

## 2017-09-07 NOTE — Progress Notes (Signed)
Office Visit Note   Patient: Christy Galvan           Date of Birth: Nov 06, 1970           MRN: [id number] Visit Date: 09/07/2017              Requested by: Natalia Leatherwood, DO [address] [place], [place] [address] PCP: Natalia Leatherwood, DO   Assessment & Plan: Visit Diagnoses:  1. Adhesive capsulitis of left shoulder     Plan: At this point, we have recommended intra-articular cortisone injection followed by formal physical therapy.  An order was put in for the injection and a physical therapy prescription was given to the patient.  She will return in 6 weeks after the cortisone injection for repeat exam and to order a second injection.  Total face to face encounter time was greater than 25 minutes and over half of this time was spent in counseling and/or coordination of care.  Follow-Up Instructions: Return in about 6 weeks (around 10/19/2017).   Orders:  Orders Placed This Encounter  Procedures  . Ambulatory referral to Physical Medicine Rehab   No orders of the defined types were placed in this encounter.     Procedures: No procedures performed   Clinical Data: No additional findings.   Subjective: Chief Complaint  Patient presents with  . Left Shoulder - Pain   Patient is a 47 year old female who presents to our clinic today to discuss MRI results of her left shoulder.  She was seen by [place] on 08/09/2017 where she was given a subacromial cortisone injection.  Phone message stated that she had relief only for that afternoon.  An MRI was then ordered which showed adhesive capsulitis and slight subacromial/subdeltoid bursitis.  Lowella Dandy HPI  Review of Systems   Objective: Vital Signs: There were no vitals taken for this visit.  Physical Exam well developed and well nourished female.  Ortho Exam Examination of her left shoulder reveals marked decreased FF, ER, IR, Abd  Specialty Comments:  No specialty comments available.  Imaging: No results found.   PMFS  History: Patient Active Problem List   Diagnosis Date Noted  . Left shoulder pain 09/07/2017  . Chronic left shoulder pain 08/09/2017  . Preventative health care 06/14/2013  . Lower extremity edema 06/14/2013   Past Medical History:  Diagnosis Date  . Chicken pox     Family History  Problem Relation Age of Onset  . Cancer Mother        Breast and Lung  . Lung cancer Mother   . Alcohol abuse Father   . Mental retardation Sister        bipolar  . Asthma Sister   . COPD Sister   . Depression Sister   . ADD / ADHD Daughter   . Heart disease Maternal Grandmother        before age 56  . Diabetes Maternal Grandmother   . Heart disease Maternal Grandfather        smoker    Past Surgical History:  Procedure Laterality Date  . NO PAST SURGERIES     Social History   Occupational History  . Occupation: Education officer, environmental  Tobacco Use  . Smoking status: Never Smoker  . Smokeless tobacco: Never Used  Substance and Sexual Activity  . Alcohol use: No  . Drug use: No  . Sexual activity: Yes    Partners: Male    Birth control/protection: Surgical

## 2017-09-08 ENCOUNTER — Other Ambulatory Visit (INDEPENDENT_AMBULATORY_CARE_PROVIDER_SITE_OTHER): Payer: Self-pay

## 2017-09-08 NOTE — Telephone Encounter (Signed)
Can [facility] get patient in sooner? If so, I can put order in, just let me know what code to use.

## 2017-09-08 NOTE — Telephone Encounter (Signed)
Please advise 

## 2017-09-08 NOTE — Telephone Encounter (Signed)
They most likely can, per Dr. Alvester Morin they put the code in.

## 2017-09-08 NOTE — Telephone Encounter (Signed)
If she can't get in until 6/24 then lets see if [facility] can do it sooner

## 2017-09-12 ENCOUNTER — Other Ambulatory Visit (INDEPENDENT_AMBULATORY_CARE_PROVIDER_SITE_OTHER): Payer: Self-pay

## 2017-09-12 DIAGNOSIS — M7502 Adhesive capsulitis of left shoulder: Secondary | ICD-10-CM

## 2017-09-12 NOTE — Telephone Encounter (Signed)
[facility] has openings for next week. Please place order if want [facility]

## 2017-09-12 NOTE — Telephone Encounter (Signed)
ORDER MADE 

## 2017-09-13 ENCOUNTER — Other Ambulatory Visit (INDEPENDENT_AMBULATORY_CARE_PROVIDER_SITE_OTHER): Payer: Self-pay | Admitting: Orthopaedic Surgery

## 2017-09-13 DIAGNOSIS — G8929 Other chronic pain: Secondary | ICD-10-CM

## 2017-09-13 DIAGNOSIS — M25512 Pain in left shoulder: Secondary | ICD-10-CM

## 2017-09-13 DIAGNOSIS — M7502 Adhesive capsulitis of left shoulder: Secondary | ICD-10-CM

## 2017-09-13 NOTE — Telephone Encounter (Signed)
SW Roberta with [facility] and she will contact pt to schedule appt 

## 2017-09-30 ENCOUNTER — Telehealth (INDEPENDENT_AMBULATORY_CARE_PROVIDER_SITE_OTHER): Payer: Self-pay | Admitting: *Deleted

## 2017-09-30 NOTE — Telephone Encounter (Signed)
Pt called to cancel appt on 6/24

## 2017-10-03 ENCOUNTER — Ambulatory Visit (INDEPENDENT_AMBULATORY_CARE_PROVIDER_SITE_OTHER): Payer: Self-pay | Admitting: Physical Medicine and Rehabilitation

## 2018-09-25 DIAGNOSIS — Z1231 Encounter for screening mammogram for malignant neoplasm of breast: Secondary | ICD-10-CM | POA: Diagnosis not present

## 2018-09-25 DIAGNOSIS — Z130 Encounter for screening for diseases of the blood and blood-forming organs and certain disorders involving the immune mechanism: Secondary | ICD-10-CM | POA: Diagnosis not present

## 2018-09-25 DIAGNOSIS — Z1151 Encounter for screening for human papillomavirus (HPV): Secondary | ICD-10-CM | POA: Diagnosis not present

## 2018-09-25 DIAGNOSIS — Z01419 Encounter for gynecological examination (general) (routine) without abnormal findings: Secondary | ICD-10-CM | POA: Diagnosis not present

## 2018-09-25 DIAGNOSIS — Z124 Encounter for screening for malignant neoplasm of cervix: Secondary | ICD-10-CM | POA: Diagnosis not present

## 2018-09-25 LAB — HM PAP SMEAR: HPV, high-risk: NEGATIVE

## 2018-09-28 ENCOUNTER — Encounter: Payer: Self-pay | Admitting: Family Medicine

## 2019-01-12 ENCOUNTER — Other Ambulatory Visit: Payer: Self-pay

## 2019-01-12 DIAGNOSIS — Z20822 Contact with and (suspected) exposure to covid-19: Secondary | ICD-10-CM

## 2019-01-13 LAB — NOVEL CORONAVIRUS, NAA: SARS-CoV-2, NAA: NOT DETECTED

## 2019-01-18 ENCOUNTER — Other Ambulatory Visit: Payer: Self-pay

## 2019-01-18 DIAGNOSIS — Z20822 Contact with and (suspected) exposure to covid-19: Secondary | ICD-10-CM

## 2019-01-18 DIAGNOSIS — Z20828 Contact with and (suspected) exposure to other viral communicable diseases: Secondary | ICD-10-CM | POA: Diagnosis not present

## 2019-01-19 LAB — NOVEL CORONAVIRUS, NAA: SARS-CoV-2, NAA: NOT DETECTED

## 2019-04-12 ENCOUNTER — Ambulatory Visit: Payer: BLUE CROSS/BLUE SHIELD | Attending: Internal Medicine

## 2019-04-12 DIAGNOSIS — Z20822 Contact with and (suspected) exposure to covid-19: Secondary | ICD-10-CM

## 2019-04-14 LAB — NOVEL CORONAVIRUS, NAA: SARS-CoV-2, NAA: NOT DETECTED

## 2019-05-15 IMAGING — MR MR SHOULDER*L* W/O CM
5 series · 40 of 40 positions shown · non-contrast
Comparison: None.

CLINICAL DATA: Left shoulder pain and limited range of motion.
Numbness in the left hand. Symptoms for 2 months. No known injury.

EXAM:
MRI OF THE LEFT SHOULDER WITHOUT CONTRAST
TECHNIQUE: Multiplanar, multisequence MR imaging of the shoulder was performed.
No intravenous contrast was administered.

[Series 3: T2 fat-sat · axial · 4.0mm · 0.55mm/px · z∈[-39,+48]mm · 10 of 20 slices shown (1 of 3)]
[im 1/20]
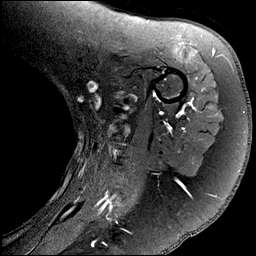
[im 3/20]
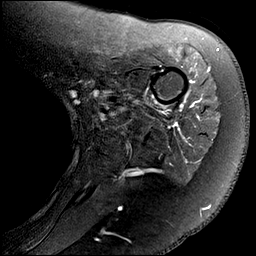
[im 5/20]
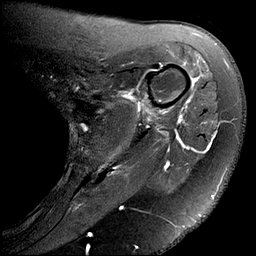
[im 7/20]
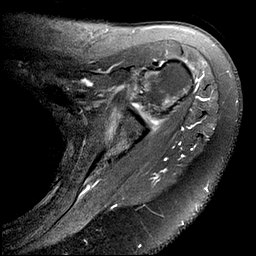
[im 9/20]
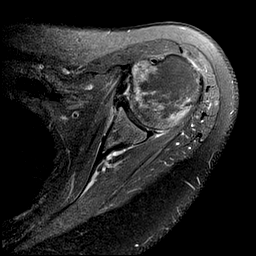
[im 11/20]
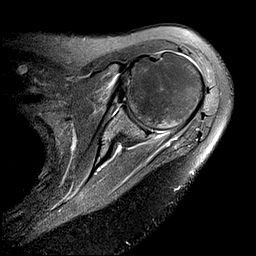
[im 13/20]
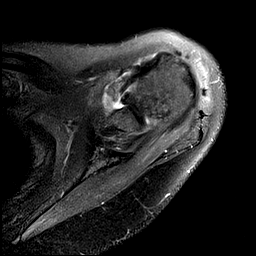
[im 15/20]
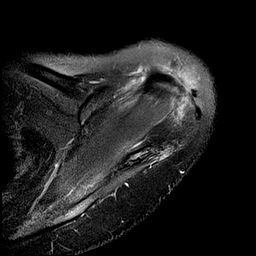
[im 17/20]
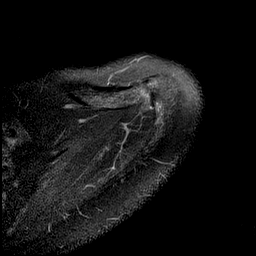
[im 20/20]
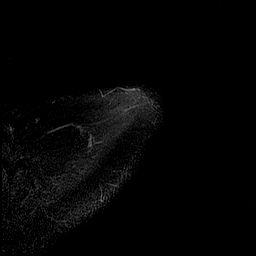

[Series 4: T2 fat-sat · oblique · 4.0mm · 0.59mm/px · 7 of 16 slices shown (2 of 3)]
[im 1/16]
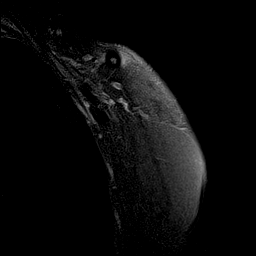
[im 3/16]
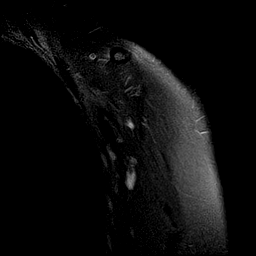
[im 6/16]
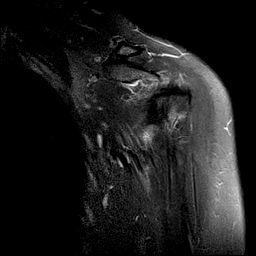
[im 8/16]
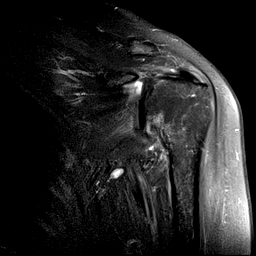
[im 11/16]
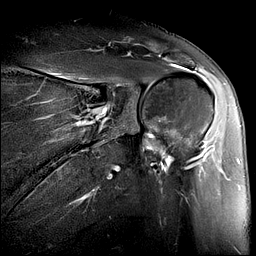
[im 13/16]
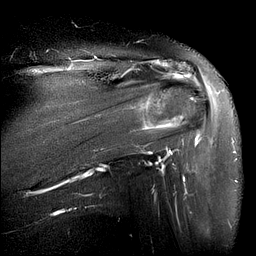
[im 16/16]
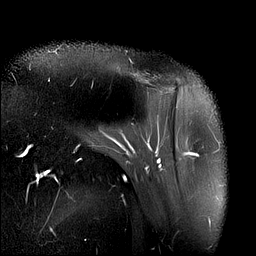

[Series 5: PD · oblique · 4.0mm · 0.59mm/px · 7 of 16 slices shown]
[im 1/16]
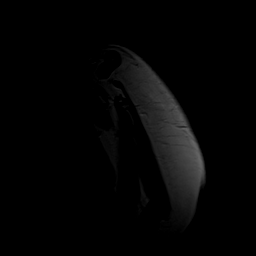
[im 3/16]
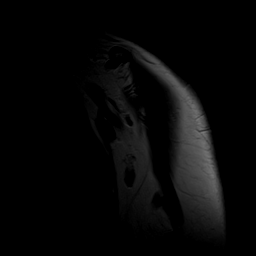
[im 6/16]
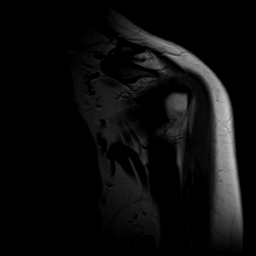
[im 8/16]
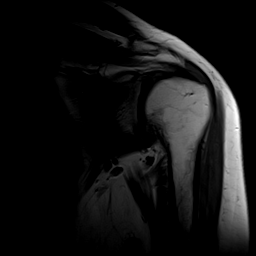
[im 11/16]
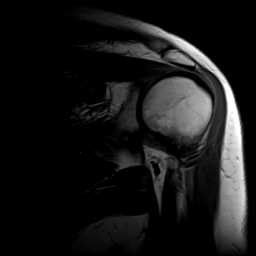
[im 13/16]
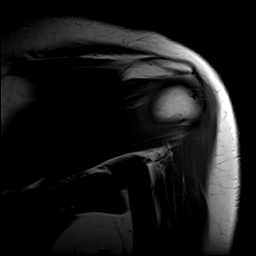
[im 16/16]
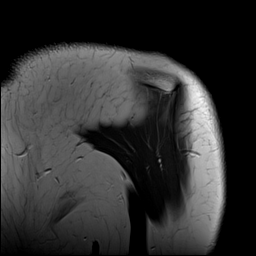

[Series 6: T2 fat-sat · oblique · 4.0mm · 0.59mm/px · 8 of 18 slices shown (3 of 3)]
[im 1/18]
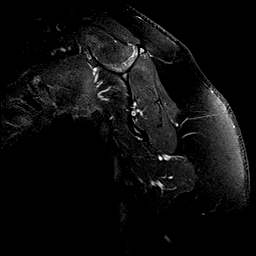
[im 3/18]
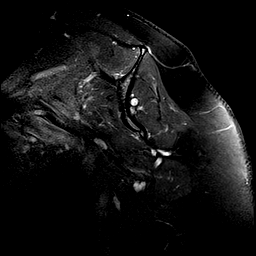
[im 5/18]
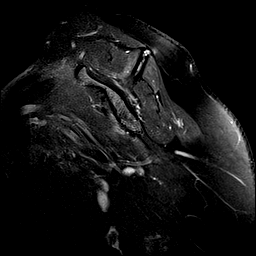
[im 8/18]
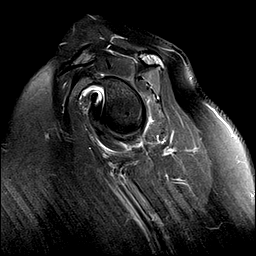
[im 10/18]
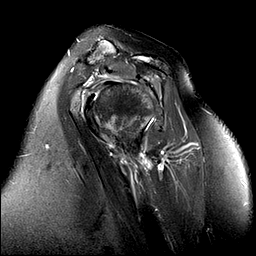
[im 13/18]
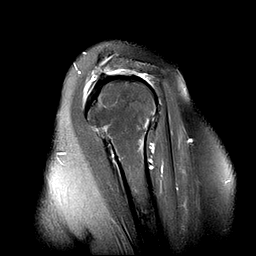
[im 15/18]
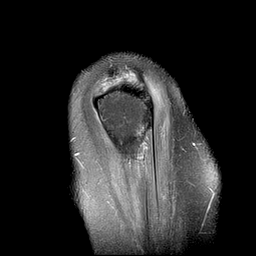
[im 18/18]
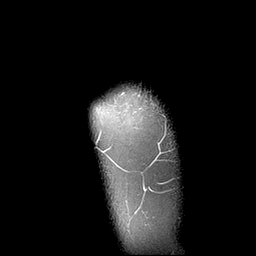

[Series 7: T1 · oblique · 4.0mm · 0.59mm/px · 8 of 18 slices shown]
[im 1/18]
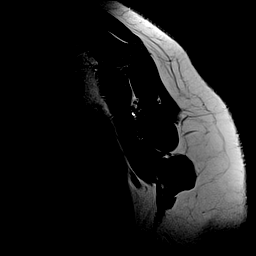
[im 3/18]
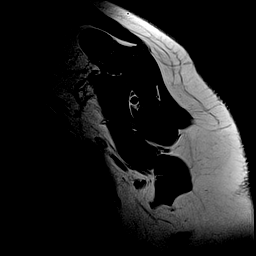
[im 5/18]
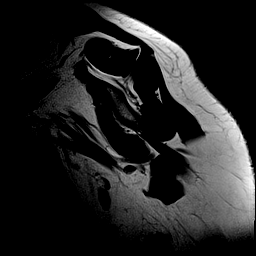
[im 8/18]
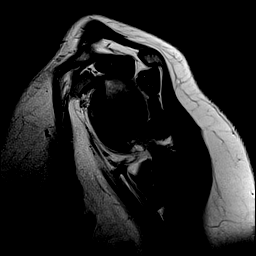
[im 10/18]
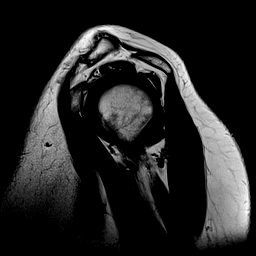
[im 13/18]
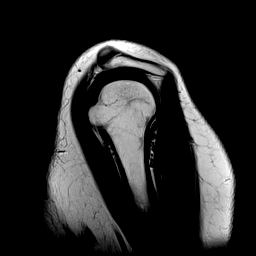
[im 15/18]
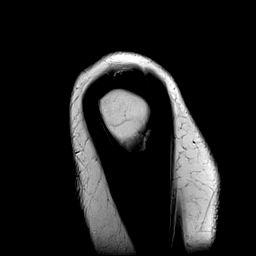
[im 18/18]
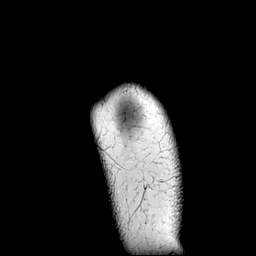

[40 of 40 positions shown; findings below may reference images not displayed]

FINDINGS: Rotator cuff: Intact with mild tendinopathy of the subscapularis
noted.

Muscles:  Normal without atrophy or focal lesion.

Biceps long head:  Intact and normal in appearance.

Acromioclavicular Joint: Appears normal. Type 2 acromion. There is
fluid in the subacromial/subdeltoid bursa.

Glenohumeral Joint: Mild thickening and intrasubstance increased T2
signal in the inferior glenohumeral ligament is compatible with
adhesive capsulitis.

Labrum:  Intact.

Bones:  No fracture or worrisome lesion.

Other: None.
IMPRESSION: Findings compatible with adhesive capsulitis.

Small volume of subacromial/subdeltoid fluid consistent with
bursitis.

Intact and normal appearing rotator cuff and long head of biceps.

## 2019-10-16 DIAGNOSIS — Z1389 Encounter for screening for other disorder: Secondary | ICD-10-CM | POA: Diagnosis not present

## 2019-10-16 DIAGNOSIS — Z6824 Body mass index (BMI) 24.0-24.9, adult: Secondary | ICD-10-CM | POA: Diagnosis not present

## 2019-10-16 DIAGNOSIS — Z01419 Encounter for gynecological examination (general) (routine) without abnormal findings: Secondary | ICD-10-CM | POA: Diagnosis not present

## 2019-10-16 DIAGNOSIS — Z130 Encounter for screening for diseases of the blood and blood-forming organs and certain disorders involving the immune mechanism: Secondary | ICD-10-CM | POA: Diagnosis not present

## 2019-10-16 DIAGNOSIS — Z1231 Encounter for screening mammogram for malignant neoplasm of breast: Secondary | ICD-10-CM | POA: Diagnosis not present

## 2019-11-16 ENCOUNTER — Other Ambulatory Visit: Payer: Self-pay

## 2019-11-16 ENCOUNTER — Ambulatory Visit (INDEPENDENT_AMBULATORY_CARE_PROVIDER_SITE_OTHER): Payer: BC Managed Care – PPO | Admitting: Family Medicine

## 2019-11-16 ENCOUNTER — Encounter: Payer: Self-pay | Admitting: Family Medicine

## 2019-11-16 VITALS — BP 98/70 | HR 75 | Temp 98.1°F | Wt 150.0 lb

## 2019-11-16 DIAGNOSIS — L859 Epidermal thickening, unspecified: Secondary | ICD-10-CM

## 2019-11-16 DIAGNOSIS — R238 Other skin changes: Secondary | ICD-10-CM

## 2019-11-16 DIAGNOSIS — T148XXA Other injury of unspecified body region, initial encounter: Secondary | ICD-10-CM

## 2019-11-16 NOTE — Progress Notes (Signed)
Subjective:    Patient ID: Christy Galvan, female    DOB: 1970-06-19, 49 y.o.   MRN: [id number]  No chief complaint on file.   HPI Pt is a 49 yo female with pmh sig for left shoulder pain who is followed by Dr. Claiborne Billings, seen today for ongoing concern.  Pt notes a bump appeared on L forehead 1.5 wks ago. Area has enlarged some.  Pt tried to pop the bump, but nothing other than blood came out.  Area is not painful, is without drainage, erythema.  pt endorses h/o sun exposure/sun damage, not wearing sunscreen years ago.  Pt tried to see Derm, but couldn't get ant appt until Oct at the earliest. Past Medical History:  Diagnosis Date  . Chicken pox     No Known Allergies  ROS General: Denies fever, chills, night sweats, changes in weight, changes in appetite HEENT: Denies headaches, ear pain, changes in vision, rhinorrhea, sore throat CV: Denies CP, palpitations, SOB, orthopnea Pulm: Denies SOB, cough, wheezing GI: Denies abdominal pain, nausea, vomiting, diarrhea, constipation GU: Denies dysuria, hematuria, frequency, vaginal discharge Msk: Denies muscle cramps, joint pains Neuro: Denies weakness, numbness, tingling Skin: Denies rashes, bruising  +bump on L forehead Psych: Denies depression, anxiety, hallucinations     Objective:    Blood pressure 98/70, pulse 75, temperature 98.1 F (36.7 C), temperature source Oral, weight 150 lb (68 kg), SpO2 99 %.   Gen. Pleasant, well-nourished, in no distress, normal affect   HEENT: [place]/AT, face symmetric, conjunctiva clear, no scleral icterus, PERRLA, EOMI, nares patent without drainage Lungs: no accessory muscle use Cardiovascular: RRR,no peripheral edema Neuro:  A&Ox3, CN II-XII intact, normal gait Skin:  Warm, dry, intact.  Papule on left lateral forehead near hairline with central eschar, edges pearly, smooth, round, 1 cm.  Biopsy obtained.    Wt Readings from Last 3 Encounters:  11/16/19 150 lb (68 kg)  07/11/17 147 lb 3.2 oz (66.8  kg)  06/12/13 135 lb 12 oz (61.6 kg)    Lab Results  Component Value Date   WBC 7.7 06/12/2013   HGB 13.5 06/12/2013   HCT 40.7 06/12/2013   PLT 238.0 06/12/2013   GLUCOSE 79 06/12/2013   CHOL 173 06/12/2013   TRIG 44.0 06/12/2013   HDL 76.50 06/12/2013   LDLCALC 88 06/12/2013   ALT 41 (H) 06/12/2013   AST 28 06/12/2013   NA 136 06/12/2013   K 4.3 06/12/2013   CL 102 06/12/2013   CREATININE 0.5 06/12/2013   BUN 6 06/12/2013   CO2 28 06/12/2013   TSH 1.64 06/12/2013   HGBA1C 5.2 06/12/2013    Biopsy procedure note:   Consent obtained.  Discussed r/b/a with patient.  Patient expressed understanding.  Allergies reviewed. Anesthesia with 1% Lidocaine with Epinephrine.  An elliptical incision made in the center lesion.  Skin of central area of lesion friable.  Samples placed in specimen container.  8 mm incision closed with 1 suture using 5-0 Vicryl.  Wound care instructions provided.  Observe for any signs of infection or other problems.  Return for suture removal in 5-7 days.  Assessment/Plan:  Papule of skin  -discussed possible causes including cyst, other benign lesion, basal cell or squamous cell carcinoma -consent obtained.  Pt tolerated procedure well. 1 suture placed.   -Though Vicryl used discussed suture removal in 5-7 days -biopsy of center of lesion sent to path - Plan: Dermatology pathology  Suture of skin wound -Given precautions -Given handouts  F/u in  5-7 days for suture removal  Abbe Amsterdam, MD

## 2019-11-16 NOTE — Patient Instructions (Addendum)
Follow up in clinic in 5-7 days for suture removal.  Excision of Skin Lesions, Care After This sheet gives you information about how to care for yourself after your procedure. Your health care provider may also give you more specific instructions. If you have problems or questions, contact your health care provider. What can I expect after the procedure? After your procedure, it is common to have pain or discomfort at the excision site. Follow these instructions at home: Excision care   Follow instructions from your health care provider about how to take care of your excision site. Make sure you: ? Wash your hands with soap and water before and after you change your bandage (dressing). If soap and water are not available, use hand sanitizer. ? Change your dressing as told by your health care provider. ? Leave stitches (sutures), skin glue, or adhesive strips in place. These skin closures may need to stay in place for 2 weeks or longer. If adhesive strip edges start to loosen and curl up, you may trim the loose edges. Do not remove adhesive strips completely unless your health care provider tells you to do that.  Check the excision area every day for signs of infection. Watch for: ? Redness, swelling, or pain. ? Fluid or blood. ? Warmth. ? Pus or a bad smell.  Keep the site clean, dry, and protected for at least 48 hours.  For bleeding, apply gentle but firm pressure to the area using a folded towel for 20 minutes.  Avoid high-impact exercise and activities until the sutures are removed or the area heals. General instructions  Take over-the-counter and prescription medicines only as told by your health care provider.  Follow instructions from your health care provider about how to minimize scarring. Scarring should lessen over time.  Avoid sun exposure until the area has healed. Use sunscreen to protect the area from the sun after it has healed.  Keep all follow-up visits as told by  your health care provider. This is important. Contact a health care provider if:  You have redness, swelling, or pain around your excision site.  You have fluid or blood coming from your excision site.  Your excision site feels warm to the touch.  You have pus or a bad smell coming from your excision site.  You have a fever.  You have pain that does not improve in 2-3 days after your procedure.  You notice skin irregularities or changes in how you feel (sensation). Summary  This sheet of instructions provides you with information about caring for yourself after your procedure. Contact your health care provider if you have any problems or questions.  Take over-the-counter and prescription medicines only as told by your health care provider.  Change your dressing as told by your health care provider.  Contact a health care provider if you have redness, swelling, pain, or other signs of infection around your excision site.  Keep all follow-up visits as told by your health care provider. This is important. This information is not intended to replace advice given to you by your health care provider. Make sure you discuss any questions you have with your health care provider. Document Revised: 10/05/2017 Document Reviewed: 10/05/2017 Elsevier Patient Education  2020 [employer]  Excision of Skin Lesions Excision of a skin lesion is the removal of a section of skin by making small cuts (incisions) in the skin. Through this process, the lesion is completely removed. This procedure is often done to treat or  prevent cancer or infection. It may also be done to improve cosmetic appearance. The procedure may be done to remove:  Cancerous (malignant) growths, such as basal cell carcinoma, squamous cell carcinoma, or melanoma.  Noncancerous (benign) growths, such as a cyst or lipoma.  Growths, such as moles or skin tags, which may be removed for cosmetic reasons. Various excision or  surgical techniques may be used depending on your condition, the location of the lesion, and your overall health. Tell a health care provider about:  Any allergies you have.  All medicines you are taking, including vitamins, herbs, eye drops, creams, and over-the-counter medicines.  Any problems you or family members have had with anesthetic medicines.  Any blood disorders you have.  Any surgeries you have had.  Any medical conditions you have or have had.  Whether you are pregnant or may be pregnant. What are the risks? Generally, this is a safe procedure. However, problems may occur, including:  Bleeding.  Infection.  Scarring.  Recurrence of the cyst, lipoma, or cancer.  Changes in skin sensation or appearance, such as discoloration or swelling.  Reaction to the anesthetics.  Allergic reaction to surgical materials or ointments.  Damage to nerves, blood vessels, muscles, or other structures.  Continued pain. What happens before the procedure? Medicines Ask your health care provider about:  Changing or stopping your regular medicines. This is especially important if you are taking diabetes medicines or blood thinners.  Taking medicines such as aspirin and ibuprofen. These medicines can thin your blood. Do not take these medicines unless your health care provider tells you to take them.  Taking over-the-counter medicines, vitamins, herbs, and supplements. General instructions  You may be asked to stop smoking.  You may have an exam or testing.  Ask your health care provider what steps will be taken to help prevent infection. These may include: ? Removing hair at the surgery site. ? Washing skin with a germ-killing soap. ? Taking antibiotic medicine. What happens during the procedure?   You will be given a medicine to numb the area (local anesthetic).  Your health care provider will remove the lesions using one of the following excision  techniques. ? Complete surgical excision. This procedure may be done to treat a cancerous growth or a noncancerous cyst or lesion.  A small scalpel or scissors will be used to gently cut around and under the lesion until it is completely removed.  If bleeding occurs, it will be stopped with a device that delivers heat (electrocautery).  The edges of the wound may be stitched (sutured) together.  A bandage (dressing) will be applied.  Samples will be sent to a lab for testing. ? Excision of a cyst.  An incision will be made on the cyst.  The entire cyst will be removed through the incision.  The incision may be closed with sutures. ? Shave excision. This may be done to remove a mole or a skin tag.  A small blade or an electrically heated loop instrument will be used to shave off the lesion.  The wound is usually left to heal on its own without sutures. ? Punch excision. This may be done to remove a mole or a scar or to do a biopsy of the lesion.  A small tool that is like a cookie cutter or a hole punch is used to cut a circle shape out of the skin.  The outer edges of the skin will be sutured together.  The sample may  be sent to a lab for testing. ? Mohs micrographic surgery. This is usually done to treat skin cancer. This type of excision is mostly used on the face and ears. This procedure is minimally invasive, and it ensures the best cosmetic outcome.  A scalpel or a loop instrument will be used to remove layers of the lesion until all the abnormal or cancerous tissue has been removed.  The wound may be sutured, depending on its size.  The tissue will be checked under a microscope right away. Each of the techniques may vary among health care providers and hospitals. At the end of any of these procedures, antibiotic ointment will be applied as needed. What happens after the procedure?  Return to your normal activities as told by your health care provider. Ask your health  care provider what activities are safe for you.  It is up to you to get the results of your procedure. Ask your doctor, or the department that is doing the procedure, when your results will be ready.  Talk with your health care provider to discuss any test results, treatment options, and if necessary, the need for more tests.  Keep all follow-up visits as told by your health care provider. This is important. Summary  Excision of a skin lesion is the removal of a section of skin by making small cuts (incisions) in the skin. This procedure is often done to treat or prevent cancer and infection, or it may be done to improve cosmetic appearance.  Various excision or surgical techniques may be used depending on your condition, the location of the lesion, and your overall health.  After the procedure, talk with your health care provider to discuss any test results, treatment options, and if necessary, the need for more tests.  Keep all follow-up visits as told by your health care provider. This is important. This information is not intended to replace advice given to you by your health care provider. Make sure you discuss any questions you have with your health care provider. Document Revised: 10/05/2017 Document Reviewed: 10/05/2017 Elsevier Patient Education  2020 [employer].

## 2019-11-23 ENCOUNTER — Encounter: Payer: Self-pay | Admitting: Family Medicine

## 2019-11-23 ENCOUNTER — Ambulatory Visit (INDEPENDENT_AMBULATORY_CARE_PROVIDER_SITE_OTHER): Payer: BC Managed Care – PPO | Admitting: Family Medicine

## 2019-11-23 ENCOUNTER — Other Ambulatory Visit: Payer: Self-pay

## 2019-11-23 VITALS — BP 96/70 | HR 68 | Temp 98.3°F | Wt 147.6 lb

## 2019-11-23 DIAGNOSIS — H1033 Unspecified acute conjunctivitis, bilateral: Secondary | ICD-10-CM | POA: Diagnosis not present

## 2019-11-23 DIAGNOSIS — Z4802 Encounter for removal of sutures: Secondary | ICD-10-CM | POA: Diagnosis not present

## 2019-11-23 DIAGNOSIS — R229 Localized swelling, mass and lump, unspecified: Secondary | ICD-10-CM

## 2019-11-23 MED ORDER — POLYMYXIN B-TRIMETHOPRIM 10000-0.1 UNIT/ML-% OP SOLN: 1.0000 [drp] | OPHTHALMIC | 0 refills | Status: AC

## 2019-11-23 NOTE — Progress Notes (Signed)
Subjective:    Patient ID: Christy Galvan, female    DOB: July 07, 1970, 49 y.o.   MRN: [id number]  No chief complaint on file.   HPI Patient was seen today for f/u and suture removal.  Pt denies issues with skin lesion on forehead.  Sample of lesion sent to path.  Pt endorses watering and erythema of b/l eyes.  Denies allergies.  Past Medical History:  Diagnosis Date  . Chicken pox     No Known Allergies  ROS General: Denies fever, chills, night sweats, changes in weight, changes in appetite HEENT: Denies headaches, ear pain, changes in vision, rhinorrhea, sore throat.  +watery, erythematous eyes. CV: Denies CP, palpitations, SOB, orthopnea Pulm: Denies SOB, cough, wheezing GI: Denies abdominal pain, nausea, vomiting, diarrhea, constipation GU: Denies dysuria, hematuria, frequency, vaginal discharge Msk: Denies muscle cramps, joint pains Neuro: Denies weakness, numbness, tingling Skin: Denies rashes, bruising  + nodule on L forehead Psych: Denies depression, anxiety, hallucinations    Objective:    Blood pressure 96/70, pulse 68, temperature 98.3 F (36.8 C), temperature source Oral, weight 147 lb 9.6 oz (67 kg), SpO2 97 %.  Gen. Pleasant, well-nourished, in no distress, normal affect   HEENT: [place]/AT, face symmetric, conjunctiva with mild injection b/l, no scleral icterus, eyes appear watery.  No matting noted.  PERRLA, EOMI, nares patent without drainage Lungs: no accessory muscle use Cardiovascular: RRR, no peripheral edema Neuro:  A&Ox3, CN II-XII intact, normal gait Skin:  Warm, dry, intact.  Left upper forehead with nodule with central eschar and 1 suture in place.  Suture removed.   Wt Readings from Last 3 Encounters:  11/23/19 147 lb 9.6 oz (67 kg)  11/16/19 150 lb (68 kg)  07/11/17 147 lb 3.2 oz (66.8 kg)    Lab Results  Component Value Date   WBC 7.7 06/12/2013   HGB 13.5 06/12/2013   HCT 40.7 06/12/2013   PLT 238.0 06/12/2013   GLUCOSE 79 06/12/2013   CHOL  173 06/12/2013   TRIG 44.0 06/12/2013   HDL 76.50 06/12/2013   LDLCALC 88 06/12/2013   ALT 41 (H) 06/12/2013   AST 28 06/12/2013   NA 136 06/12/2013   K 4.3 06/12/2013   CL 102 06/12/2013   CREATININE 0.5 06/12/2013   BUN 6 06/12/2013   CO2 28 06/12/2013   TSH 1.64 06/12/2013   HGBA1C 5.2 06/12/2013    Assessment/Plan:  Visit for suture removal -Consent obtained.  1 suture removed.  Pt tolerated procedure well.  Acute bacterial conjunctivitis of both eyes  -given handout - Plan: trimethoprim-polymyxin b (POLYTRIM) ophthalmic solution  Skin nodule  -At last OFV sample sent to path, however friable.  Superficial biopsy, hyperkeratotic material noted. -ok to use Neosporin on lesion -Sent to Derm for removal and further path - Plan: Ambulatory referral to Dermatology  F/u prn  Abbe Amsterdam, MD

## 2019-11-23 NOTE — Patient Instructions (Signed)
Bacterial Conjunctivitis, Adult Bacterial conjunctivitis is an infection of the clear membrane that covers the white part of your eye and the inner surface of your eyelid (conjunctiva). When the blood vessels in your conjunctiva become inflamed, your eye becomes red or pink, and it will probably feel itchy. Bacterial conjunctivitis spreads very easily from person to person (is contagious). It also spreads easily from one eye to the other eye. What are the causes? This condition is caused by bacteria. You may get the infection if you come into close contact with:  A person who is infected with the bacteria.  Items that are contaminated with the bacteria, such as a face towel, contact lens solution, or eye makeup. What increases the risk? You are more likely to develop this condition if you:  Are exposed to other people who have the infection.  Wear contact lenses.  Have a sinus infection.  Have had a recent eye injury or surgery.  Have a weak body defense system (immune system).  Have a medical condition that causes dry eyes. What are the signs or symptoms? Symptoms of this condition include:  Thick, yellowish discharge from the eye. This may turn into a crust on the eyelid overnight and cause your eyelids to stick together.  Tearing or watery eyes.  Itchy eyes.  Burning feeling in your eyes.  Eye redness.  Swollen eyelids.  Blurred vision. How is this diagnosed? This condition is diagnosed based on your symptoms and medical history. Your health care provider may also take a sample of discharge from your eye to find the cause of your infection. This is rarely done. How is this treated? This condition may be treated with:  Antibiotic eye drops or ointment to clear the infection more quickly and prevent the spread of infection to others.  Oral antibiotic medicines to treat infections that do not respond to drops or ointments or that last longer than 10 days.  Cool, wet  cloths (cool compresses) placed on the eyes.  Artificial tears applied 2-6 times a day. Follow these instructions at home: Medicines  Take or apply your antibiotic medicine as told by your health care provider. Do not stop taking or applying the antibiotic even if you start to feel better.  Take or apply over-the-counter and prescription medicines only as told by your health care provider.  Be very careful to avoid touching the edge of your eyelid with the eye-drop bottle or the ointment tube when you apply medicines to the affected eye. This will keep you from spreading the infection to your other eye or to other people. Managing discomfort  Gently wipe away any drainage from your eye with a warm, wet washcloth or a cotton ball.  Apply a clean, cool compress to your eye for 10-20 minutes, 3-4 times a day. General instructions  Do not wear contact lenses until the inflammation is gone and your health care provider says it is safe to wear them again. Ask your health care provider how to sterilize or replace your contact lenses before you use them again. Wear glasses until you can resume wearing contact lenses.  Avoid wearing eye makeup until the inflammation is gone. Throw away any old eye cosmetics that may be contaminated.  Change or wash your pillowcase every day.  Do not share towels or washcloths. This may spread the infection.  Wash your hands often with soap and water. Use paper towels to dry your hands.  Avoid touching or rubbing your eyes.  Do not   drive or use heavy machinery if your vision is blurred. Contact a health care provider if:  You have a fever.  Your symptoms do not get better after 10 days. Get help right away if you have:  A fever and your symptoms suddenly get worse.  Severe pain when you move your eye.  Facial pain, redness, or swelling.  Sudden loss of vision. Summary  Bacterial conjunctivitis is an infection of the clear membrane that covers the  white part of your eye and the inner surface of your eyelid (conjunctiva).  Bacterial conjunctivitis spreads very easily from person to person (is contagious).  Wash your hands often with soap and water. Use paper towels to dry your hands.  Take or apply your antibiotic medicine as told by your health care provider. Do not stop taking or applying the antibiotic even if you start to feel better.  Contact a health care provider if you have a fever or your symptoms do not get better after 10 days. This information is not intended to replace advice given to you by your health care provider. Make sure you discuss any questions you have with your health care provider. Document Revised: 07/18/2018 Document Reviewed: 11/02/2017 Elsevier Patient Education  2020 Elsevier Inc.  Suture Removal, Care After This sheet gives you information about how to care for yourself after your procedure. Your health care provider may also give you more specific instructions. If you have problems or questions, contact your health care provider. What can I expect after the procedure? After your stitches (sutures) are removed, it is common to have:  Some discomfort and swelling in the area.  Slight redness in the area. Follow these instructions at home: If you have a bandage:  Wash your hands with soap and water before you change your bandage (dressing). If soap and water are not available, use hand sanitizer.  Change your dressing as told by your health care provider. If your dressing becomes wet or dirty, or develops a bad smell, change it as soon as possible.  If your dressing sticks to your skin, soak it in warm water to loosen it. Wound care   Check your wound every day for signs of infection. Check for: ? More redness, swelling, or pain. ? Fluid or blood. ? Warmth. ? Pus or a bad smell.  Wash your hands with soap and water before and after touching your wound.  Apply cream or ointment only as  directed by your health care provider. If you are using cream or ointment, wash the area with soap and water 2 times a day to remove all the cream or ointment. Rinse off the soap and pat the area dry with a clean towel.  If you have skin glue or adhesive strips on your wound, leave these closures in place. They may need to stay in place for 2 weeks or longer. If adhesive strip edges start to loosen and curl up, you may trim the loose edges. Do not remove adhesive strips completely unless your health care provider tells you to do that.  Keep the wound area dry and clean. Do not take baths, swim, or use a hot tub until your health care provider approves.  Continue to protect the wound from injury.  Do not pick at your wound. Picking can cause an infection.  When your wound has completely healed, wear sunscreen over it or cover it with clothing when you are outside. New scars get sunburned easily, which can make scarring worse.  General instructions  Take over-the-counter and prescription medicines only as told by your health care provider.  Keep all follow-up visits as told by your health care provider. This is important. Contact a health care provider if:  You have redness, swelling, or pain around your wound.  You have fluid or blood coming from your wound.  Your wound feels warm to the touch.  You have pus or a bad smell coming from your wound.  Your wound opens up. Get help right away if:  You have a fever.  You have redness that is spreading from your wound. Summary  After your sutures are removed, it is common to have some discomfort and swelling in the area.  Wash your hands with soap and water before you change your bandage (dressing).  Keep the wound area dry and clean. Do not take baths, swim, or use a hot tub until your health care provider approves. This information is not intended to replace advice given to you by your health care provider. Make sure you discuss any  questions you have with your health care provider. Document Revised: 03/11/2017 Document Reviewed: 05/04/2016 Elsevier Patient Education  2020 [employer].

## 2019-12-26 DIAGNOSIS — Z20822 Contact with and (suspected) exposure to covid-19: Secondary | ICD-10-CM | POA: Diagnosis not present

## 2020-01-02 DIAGNOSIS — H1045 Other chronic allergic conjunctivitis: Secondary | ICD-10-CM | POA: Diagnosis not present

## 2020-03-05 DIAGNOSIS — Z1159 Encounter for screening for other viral diseases: Secondary | ICD-10-CM | POA: Diagnosis not present

## 2020-03-10 DIAGNOSIS — K635 Polyp of colon: Secondary | ICD-10-CM | POA: Diagnosis not present

## 2020-03-10 DIAGNOSIS — K5730 Diverticulosis of large intestine without perforation or abscess without bleeding: Secondary | ICD-10-CM | POA: Diagnosis not present

## 2020-03-10 DIAGNOSIS — D122 Benign neoplasm of ascending colon: Secondary | ICD-10-CM | POA: Diagnosis not present

## 2020-03-10 DIAGNOSIS — K648 Other hemorrhoids: Secondary | ICD-10-CM | POA: Diagnosis not present

## 2020-03-10 DIAGNOSIS — Z1211 Encounter for screening for malignant neoplasm of colon: Secondary | ICD-10-CM | POA: Diagnosis not present

## 2020-03-10 LAB — HM COLONOSCOPY

## 2022-11-15 LAB — HM MAMMOGRAPHY

## 2023-06-17 ENCOUNTER — Encounter (HOSPITAL_BASED_OUTPATIENT_CLINIC_OR_DEPARTMENT_OTHER): Payer: Self-pay | Admitting: *Deleted

## 2023-06-17 ENCOUNTER — Encounter (HOSPITAL_BASED_OUTPATIENT_CLINIC_OR_DEPARTMENT_OTHER): Payer: Self-pay | Admitting: Family Medicine

## 2023-06-17 ENCOUNTER — Ambulatory Visit (INDEPENDENT_AMBULATORY_CARE_PROVIDER_SITE_OTHER): Payer: BC Managed Care – PPO | Admitting: Family Medicine

## 2023-06-17 VITALS — BP 116/66 | HR 59 | Ht 64.0 in | Wt 144.3 lb

## 2023-06-17 DIAGNOSIS — Z13220 Encounter for screening for lipoid disorders: Secondary | ICD-10-CM

## 2023-06-17 DIAGNOSIS — Z0000 Encounter for general adult medical examination without abnormal findings: Secondary | ICD-10-CM

## 2023-06-17 LAB — CBC WITH DIFFERENTIAL/PLATELET
Basophils Absolute: 0 10*3/uL (ref 0.0–0.2)
Basos: 0 %
EOS (ABSOLUTE): 0.1 10*3/uL (ref 0.0–0.4)
Eos: 1 %
Hematocrit: 41.6 % (ref 34.0–46.6)
Hemoglobin: 14 g/dL (ref 11.1–15.9)
Immature Grans (Abs): 0 10*3/uL (ref 0.0–0.1)
Immature Granulocytes: 0 %
Lymphocytes Absolute: 1.9 10*3/uL (ref 0.7–3.1)
Lymphs: 44 %
MCH: 31.5 pg (ref 26.6–33.0)
MCHC: 33.7 g/dL (ref 31.5–35.7)
MCV: 94 fL (ref 79–97)
Monocytes Absolute: 0.3 10*3/uL (ref 0.1–0.9)
Monocytes: 8 %
Neutrophils Absolute: 2 10*3/uL (ref 1.4–7.0)
Neutrophils: 47 %
Platelets: 244 10*3/uL (ref 150–450)
RBC: 4.44 x10E6/uL (ref 3.77–5.28)
RDW: 11.3 % — ABNORMAL LOW (ref 11.7–15.4)
WBC: 4.2 10*3/uL (ref 3.4–10.8)

## 2023-06-17 LAB — COMPREHENSIVE METABOLIC PANEL
ALT: 28 IU/L (ref 0–32)
AST: 28 IU/L (ref 0–40)
Albumin: 4.4 g/dL (ref 3.8–4.9)
Alkaline Phosphatase: 80 IU/L (ref 44–121)
BUN/Creatinine Ratio: 15 (ref 9–23)
BUN: 10 mg/dL (ref 6–24)
Bilirubin Total: <0.2 mg/dL (ref 0.0–1.2)
CO2: 24 mmol/L (ref 20–29)
Calcium: 9.4 mg/dL (ref 8.7–10.2)
Chloride: 105 mmol/L (ref 96–106)
Creatinine, Ser: 0.65 mg/dL (ref 0.57–1.00)
Globulin, Total: 3.2 g/dL (ref 1.5–4.5)
Glucose: 89 mg/dL (ref 70–99)
Potassium: 5.5 mmol/L — ABNORMAL HIGH (ref 3.5–5.2)
Sodium: 142 mmol/L (ref 134–144)
Total Protein: 7.6 g/dL (ref 6.0–8.5)
eGFR: 105 mL/min/{1.73_m2} (ref >59)

## 2023-06-17 LAB — LIPID PANEL
Chol/HDL Ratio: 2.4 ratio (ref 0.0–4.4)
Cholesterol, Total: 186 mg/dL (ref 100–199)
HDL: 78 mg/dL (ref >39)
LDL Chol Calc (NIH): 99 mg/dL (ref 0–99)
Triglycerides: 44 mg/dL (ref 0–149)
VLDL Cholesterol Cal: 9 mg/dL (ref 5–40)

## 2023-06-17 NOTE — Patient Instructions (Addendum)
 Christy Galvan's Liquid Vitamin  Vitamin D 1000units and Calcium 600-800 units daily for prevention of osteoporosis   Things to do to keep yourself healthy: - Exercise at least 30-45 minutes a day, 3-4 days a week.  - Eat a low-fat diet with lots of fruits and vegetables, up to 7-9 servings per day.  - Seatbelts can save your life. Wear them always.  - Smoke detectors on every level of your home, check batteries every year.  - Eye Doctor - have an eye exam every 1-2 years  - Safe sex - if you may be exposed to STDs, use a condom.  - No smoking, vaping, or use of any tobacco products.  - Alcohol -  If you drink, do it moderately, less than 2 drinks per day.  - No illegal drug use.  - Depression is common in our stressful world.If you're feeling down or losing interest in things you normally enjoy, please come in for a visit.  - Violence - If anyone is threatening or hurting you, please call immediately.

## 2023-06-17 NOTE — Progress Notes (Signed)
 Subjective:   Christy Galvan [facility] [birth year]  06/17/2023   CC: Chief Complaint  Patient presents with   New Patient (Initial Visit)    Patient is here today to get established with the practice. Denies any main concerns for today's visit.    HPI: Christy Galvan is a 53 y.o. presenting to establish care with PCP. She previously had primary care through her OBGYN and will continue with screening pap and mammograms through that office. She is a female who presents for a routine health maintenance exam.  Labs collected at time of visit.    HEALTH SCREENINGS: - Vision Screening:  Up to Date with Eye Appt  per patient - Dental Visits: up to date - Pap smear: up to date - Breast Exam: up to date - STD Screening: Declined - Mammogram (40+): Up to date  per patient w/ OBGYN; will obtain records - Colonoscopy (45+): Up to date  - Bone Density (65+ or under 65 with predisposing conditions): Not applicable  - Lung CA screening with low-dose CT:  Not applicable Adults age 30-80 who are current cigarette smokers or quit within the last 15 years. Must have 20 pack year history.   Depression and Anxiety Screen done today and results listed below:     06/17/2023    9:27 AM 07/11/2017    3:05 PM  Depression screen PHQ 2/9  Decreased Interest 0 0  Down, Depressed, Hopeless 0 0  PHQ - 2 Score 0 0  Altered sleeping 0   Tired, decreased energy 0   Change in appetite 0   Feeling bad or failure about yourself  0   Trouble concentrating 0   Moving slowly or fidgety/restless 0   Suicidal thoughts 0   PHQ-9 Score 0   Difficult doing work/chores Not difficult at all       06/17/2023    9:27 AM  GAD 7 : Generalized Anxiety Score  Nervous, Anxious, on Edge 0  Control/stop worrying 0  Worry too much - different things 0  Trouble relaxing 0  Restless 0  Easily annoyed or irritable 0  Afraid - awful might happen 0  Total GAD 7 Score 0  Anxiety Difficulty Not difficult at all     IMMUNIZATIONS: - Tdap: Tetanus vaccination status reviewed: Declined. . - HPV: Not applicable - Influenza: Refused - Pneumovax: Not applicable - Prevnar 20: Not applicable - Shingrix (50+): Refused   Past medical history, surgical history, medications, allergies, family history and social history reviewed with patient today and changes made to appropriate areas of the chart.   Past Medical History:  Diagnosis Date   Chicken pox     Past Surgical History:  Procedure Laterality Date   NO PAST SURGERIES      No current outpatient medications on file prior to visit.   No current facility-administered medications on file prior to visit.    No Known Allergies   Social History   Socioeconomic History   Marital status: Married    Spouse name: Bill   Number of children: 2   Years of education: College G[employer]   Highest education level: Not on file  Occupational History   Occupation: Education officer, environmental  Tobacco Use   Smoking status: Never   Smokeless tobacco: Never  Vaping Use   Vaping status: Never Used  Substance and Sexual Activity   Alcohol use: No   Drug use: No   Sexual activity: Yes    Partners: Male  Birth control/protection: Surgical  Other Topics Concern   Not on file  Social History Narrative   Ms. Bracewell lives with her husband & 2 children. She works PT as a Marketing executive. She is from [place]   Social alcohol consumption.  Non-smoker.   Processes routinely.   X a daily vitamin.   Smoke alarm present in the home.  Wears her seatbelt.   Feels safe in her relationships.   Social Drivers of Corporate investment banker Strain: Low Risk  (06/17/2023)   Overall Financial Resource Strain (CARDIA)    Difficulty of Paying Living Expenses: Not hard at all  Food Insecurity: No Food Insecurity (06/17/2023)   Hunger Vital Sign    Worried About Running Out of Food in the Last Year: Never true    Ran Out of Food in the Last Year: Never true   Transportation Needs: No Transportation Needs (06/17/2023)   PRAPARE - Administrator, Civil Service (Medical): No    Lack of Transportation (Non-Medical): No  Physical Activity: Sufficiently Active (06/17/2023)   Exercise Vital Sign    Days of Exercise per Week: 7 days    Minutes of Exercise per Session: 60 min  Stress: No Stress Concern Present (06/17/2023)   [employer] of Occupational Health - Occupational Stress Questionnaire    Feeling of Stress : Not at all  Social Connections: Moderately Integrated (06/17/2023)   Social Connection and Isolation Panel [NHANES]    Frequency of Communication with Friends and Family: More than three times a week    Frequency of Social Gatherings with Friends and Family: Once a week    Attends Religious Services: Never    Database administrator or Organizations: Yes    Attends Banker Meetings: 1 to 4 times per year    Marital Status: Married  Catering manager Violence: Not At Risk (06/17/2023)   Humiliation, Afraid, Rape, and Kick questionnaire    Fear of Current or Ex-Partner: No    Emotionally Abused: No    Physically Abused: No    Sexually Abused: No   Social History   Tobacco Use  Smoking Status Never  Smokeless Tobacco Never   Social History   Substance and Sexual Activity  Alcohol Use No    Family History  Problem Relation Age of Onset   Cancer Mother        Breast and Lung   Lung cancer Mother    Alcohol abuse Father    Mental retardation Sister        bipolar   Asthma Sister    COPD Sister    Depression Sister    ADD / ADHD Daughter    Heart disease Maternal Grandmother        before age 55   Diabetes Maternal Grandmother    Heart disease Maternal Grandfather        smoker     ROS: Denies fever, fatigue, unexplained weight loss/gain, chest pain, SHOB, and palpitations. Denies neurological deficits, gastrointestinal or genitourinary complaints, and skin changes.   Objective:   Today's  Vitals   06/17/23 0921  BP: 116/66  Pulse: (!) 59  SpO2: 98%  Weight: 144 lb 4.8 oz (65.5 kg)  Height: 5\' 4"  (1.626 m)    GENERAL APPEARANCE: Well-appearing, in NAD. Well nourished.  SKIN: Pink, warm and dry. Turgor normal. No rash, lesion, ulceration, or ecchymoses. Hair evenly distributed.  HEENT: HEAD: Normocephalic.  EYES: PERRLA. EOMI. Lids intact w/o defect.  Sclera white, Conjunctiva pink w/o exudate.  EARS: External ear w/o redness, swelling, masses or lesions. EAC clear. TM's intact, translucent w/o bulging, appropriate landmarks visualized. Appropriate acuity to conversational tones.  NOSE: Septum midline w/o deformity. Nares patent, mucosa pink and non-inflamed w/o drainage. No sinus tenderness.  THROAT: Uvula midline. Oropharynx clear. Tonsils non-inflamed w/o exudate. Oral mucosa pink and moist.  NECK: Supple, Trachea midline. Full ROM w/o pain or tenderness. No lymphadenopathy. Thyroid non-tender w/o enlargement or palpable masses.  RESPIRATORY: Chest wall symmetrical w/o masses. Respirations even and non-labored. Breath sounds clear to auscultation bilaterally. No wheezes, rales, rhonchi, or crackles. CARDIAC: S1, S2 present, regular rate and rhythm. No gallops, murmurs, rubs, or clicks. PMI w/o lifts, heaves, or thrills. No carotid bruits. Capillary refill <2 seconds. Peripheral pulses 2+ bilaterally. GI: Abdomen soft w/o distention. Normoactive bowel sounds. No palpable masses or tenderness. No guarding or rebound tenderness. Liver and spleen w/o tenderness or enlargement. No CVA tenderness.  MSK: Muscle tone and strength appropriate for age, w/o atrophy or abnormal movement.  EXTREMITIES: Active ROM intact, w/o tenderness, crepitus, or contracture. No obvious joint deformities or effusions. No clubbing, edema, or cyanosis.  NEUROLOGIC: CN's II-XII intact. Motor strength symmetrical with no obvious weakness. No sensory deficits. DTR's 2+ symmetric bilaterally. Steady, even  gait.  PSYCH/MENTAL STATUS: Alert, oriented x 3. Cooperative, appropriate mood and affect.    Assessment & Plan:  1. Annual physical exam (Primary) Patient is doing well overall. No chronic conditions. She is active with activities outdoors and exercised regularly. Recommend vitamin d and calcium supplement for prevention of osteoporosis and start DXA at 55yo. Continue regular exercise and lifestyle management. Will obtain fasting labs today as part of AE.   - Lipid panel - Comprehensive metabolic panel - CBC with Differential/Platelet  2. Screening for lipid disorders - Lipid panel   Orders Placed This Encounter  Procedures   Lipid panel   Comprehensive metabolic panel   CBC with Differential/Platelet    PATIENT COUNSELING:  - Encouraged a healthy well-balanced diet. Patient may adjust caloric intake to maintain or achieve ideal body weight. May reduce intake of dietary saturated fat and total fat and have adequate dietary potassium and calcium preferably from fresh fruits, vegetables, and low-fat dairy products.   - Advised to avoid cigarette smoking. - Discussed with the patient that most people either abstain from alcohol or drink within safe limits (<=14/week and <=4 drinks/occasion for males, <=7/weeks and <= 3 drinks/occasion for females) and that the risk for alcohol disorders and other health effects rises proportionally with the number of drinks per week and how often a drinker exceeds daily limits. - Discussed cessation/primary prevention of drug use and availability of treatment for abuse.  - Discussed sexually transmitted diseases, avoidance of unintended pregnancy and contraceptive alternatives.  - Stressed the importance of regular exercise - Injury prevention: Discussed safety belts, safety helmets, smoke detector, smoking near bedding or upholstery.  - Dental health: Discussed importance of regular tooth brushing, flossing, and dental visits.   NEXT PREVENTATIVE  PHYSICAL DUE IN 1 YEAR.  Return in about 1 year (around 06/16/2024) for ANNUAL PHYSICAL (fasting labs at visit) .  Patient to reach out to office if new, worrisome, or unresolved symptoms arise or if no improvement in patient's condition. Patient verbalized understanding and is agreeable to treatment plan. All questions answered to patient's satisfaction.    Hilbert Bible, [place]

## 2023-06-19 NOTE — Progress Notes (Signed)
 Please let Christy Galvan know her cholesterol was normal. Her blood counts were normal without signs of anemia or platelet disorder. Her RDW was slightly decreased possibly due to slight dehydration w/ labs. Her potassium was elevated and I would recommend a recheck in 1 week w/ lab visit w/ BMP only and have her increase her clear fluids. We will recheck annual labs at her next AE .

## 2023-06-20 ENCOUNTER — Telehealth (HOSPITAL_BASED_OUTPATIENT_CLINIC_OR_DEPARTMENT_OTHER): Payer: Self-pay | Admitting: Family Medicine

## 2023-06-20 DIAGNOSIS — E875 Hyperkalemia: Secondary | ICD-10-CM

## 2023-06-20 NOTE — Telephone Encounter (Signed)
 Pt called, states she was unsure if she needed to repeat lab or not. Advised pt based on provider's recommendation would repeat BMP. Advised I would pass along to provider so order can be placed and attempted to schedule but [facility] said no appt needed so if appt needed can verify. Pt verbalized understanding.   Christy Galvan [place], [place] 06/19/2023  6:51 PM EDT     Please let [name] know her cholesterol was normal. Her blood counts were normal without signs of anemia or platelet disorder. Her RDW was slightly decreased possibly due to slight dehydration w/ labs. Her potassium was elevated and I would recommend a recheck in 1 week w/ lab visit w/ BMP only and have her increase her clear fluids. We will recheck annual labs at her next AE .   Copied from CRM [id number]. Topic: Clinical - Request for Lab/Test Order >> Jun 20, 2023  1:43 PM Alessandra Bevels wrote: Reason for CRM: Patient is calling with clairification of labs. Please advise

## 2023-06-20 NOTE — Addendum Note (Signed)
 Addended by: Wyvonne Lenz on: 06/20/2023 04:27 PM   Modules accepted: Orders

## 2023-07-01 ENCOUNTER — Other Ambulatory Visit (HOSPITAL_BASED_OUTPATIENT_CLINIC_OR_DEPARTMENT_OTHER)

## 2023-07-08 ENCOUNTER — Other Ambulatory Visit (HOSPITAL_BASED_OUTPATIENT_CLINIC_OR_DEPARTMENT_OTHER)

## 2023-07-08 DIAGNOSIS — E875 Hyperkalemia: Secondary | ICD-10-CM

## 2023-07-09 LAB — BASIC METABOLIC PANEL WITH GFR
BUN/Creatinine Ratio: 20 (ref 9–23)
BUN: 11 mg/dL (ref 6–24)
CO2: 20 mmol/L (ref 20–29)
Calcium: 8.9 mg/dL (ref 8.7–10.2)
Chloride: 99 mmol/L (ref 96–106)
Creatinine, Ser: 0.54 mg/dL — ABNORMAL LOW (ref 0.57–1.00)
Glucose: 85 mg/dL (ref 70–99)
Potassium: 4.9 mmol/L (ref 3.5–5.2)
Sodium: 133 mmol/L — ABNORMAL LOW (ref 134–144)
eGFR: 110 mL/min/{1.73_m2} (ref >59)

## 2023-07-10 ENCOUNTER — Encounter (HOSPITAL_BASED_OUTPATIENT_CLINIC_OR_DEPARTMENT_OTHER): Payer: Self-pay | Admitting: Family Medicine

## 2023-07-10 ENCOUNTER — Other Ambulatory Visit (HOSPITAL_BASED_OUTPATIENT_CLINIC_OR_DEPARTMENT_OTHER): Payer: Self-pay | Admitting: Family Medicine

## 2023-07-10 DIAGNOSIS — E875 Hyperkalemia: Secondary | ICD-10-CM

## 2023-07-10 NOTE — Progress Notes (Signed)
 Please make lab appt for BMP in 4 months.   Hi Christy Galvan,  Your renal function has improved. Your potassium is normal with rehydration. Your sodium is slightly low likely due to increased hydration and mild dilution. I would plan to recheck you BMP in 4 months and at your physical. We will send a reminder to you to have this drawn.

## 2023-11-18 ENCOUNTER — Other Ambulatory Visit (HOSPITAL_BASED_OUTPATIENT_CLINIC_OR_DEPARTMENT_OTHER)

## 2024-06-22 ENCOUNTER — Encounter (HOSPITAL_BASED_OUTPATIENT_CLINIC_OR_DEPARTMENT_OTHER): Admitting: Family Medicine
